# Patient Record
Sex: Female | Born: 1935 | Race: White | Hispanic: No | Smoking: Never smoker
Health system: Southern US, Community
[De-identification: ages and names within clinical notes are randomized; demographics above are authoritative.]

## PROBLEM LIST (undated history)

## (undated) DIAGNOSIS — R296 Repeated falls: Secondary | ICD-10-CM

## (undated) DIAGNOSIS — IMO0002 Reserved for concepts with insufficient information to code with codable children: Secondary | ICD-10-CM

## (undated) DIAGNOSIS — G309 Alzheimer's disease, unspecified: Secondary | ICD-10-CM

## (undated) DIAGNOSIS — F0280 Dementia in other diseases classified elsewhere without behavioral disturbance: Secondary | ICD-10-CM

## (undated) HISTORY — PX: CATARACT EXTRACTION, BILATERAL: SHX1313

## (undated) HISTORY — PX: WRIST SURGERY: SHX841

---

## 1998-07-12 ENCOUNTER — Emergency Department (HOSPITAL_COMMUNITY): Admission: EM | Admit: 1998-07-12 | Discharge: 1998-07-13 | Payer: Self-pay | Admitting: Emergency Medicine

## 1998-07-13 ENCOUNTER — Encounter: Payer: Self-pay | Admitting: Emergency Medicine

## 1998-08-18 ENCOUNTER — Ambulatory Visit (HOSPITAL_COMMUNITY): Admission: RE | Admit: 1998-08-18 | Discharge: 1998-08-18 | Payer: Self-pay

## 2001-11-14 ENCOUNTER — Encounter: Admission: RE | Admit: 2001-11-14 | Discharge: 2001-11-14 | Payer: Self-pay

## 2002-11-19 ENCOUNTER — Encounter: Admission: RE | Admit: 2002-11-19 | Discharge: 2002-11-19 | Payer: Self-pay

## 2003-11-21 ENCOUNTER — Encounter: Admission: RE | Admit: 2003-11-21 | Discharge: 2003-11-21 | Payer: Self-pay

## 2005-01-20 ENCOUNTER — Encounter: Admission: RE | Admit: 2005-01-20 | Discharge: 2005-01-20 | Payer: Self-pay

## 2006-01-31 ENCOUNTER — Encounter: Admission: RE | Admit: 2006-01-31 | Discharge: 2006-01-31 | Payer: Self-pay | Admitting: Family Medicine

## 2007-02-22 ENCOUNTER — Encounter: Admission: RE | Admit: 2007-02-22 | Discharge: 2007-02-22 | Payer: Self-pay | Admitting: Family Medicine

## 2007-03-15 ENCOUNTER — Encounter: Admission: RE | Admit: 2007-03-15 | Discharge: 2007-03-15 | Payer: Self-pay | Admitting: Family Medicine

## 2008-04-08 ENCOUNTER — Encounter: Admission: RE | Admit: 2008-04-08 | Discharge: 2008-04-08 | Payer: Self-pay | Admitting: Family Medicine

## 2008-09-04 ENCOUNTER — Ambulatory Visit: Payer: Self-pay | Admitting: Ophthalmology

## 2008-09-17 ENCOUNTER — Ambulatory Visit: Payer: Self-pay | Admitting: Ophthalmology

## 2009-04-09 ENCOUNTER — Encounter: Admission: RE | Admit: 2009-04-09 | Discharge: 2009-04-09 | Payer: Self-pay | Admitting: Family Medicine

## 2010-03-17 ENCOUNTER — Encounter: Admission: RE | Admit: 2010-03-17 | Discharge: 2010-03-17 | Payer: Self-pay | Admitting: Neurology

## 2010-10-11 ENCOUNTER — Emergency Department (HOSPITAL_COMMUNITY): Payer: Medicare Other

## 2010-10-11 ENCOUNTER — Emergency Department (HOSPITAL_COMMUNITY)
Admission: EM | Admit: 2010-10-11 | Discharge: 2010-10-11 | Disposition: A | Discharge status: Home / Self Care | Payer: Medicare Other | Attending: Emergency Medicine | Admitting: Emergency Medicine

## 2010-10-11 DIAGNOSIS — F0280 Dementia in other diseases classified elsewhere without behavioral disturbance: Secondary | ICD-10-CM | POA: Insufficient documentation

## 2010-10-11 DIAGNOSIS — M899 Disorder of bone, unspecified: Secondary | ICD-10-CM | POA: Insufficient documentation

## 2010-10-11 DIAGNOSIS — W1809XA Striking against other object with subsequent fall, initial encounter: Secondary | ICD-10-CM | POA: Insufficient documentation

## 2010-10-11 DIAGNOSIS — G309 Alzheimer's disease, unspecified: Secondary | ICD-10-CM | POA: Insufficient documentation

## 2010-10-11 DIAGNOSIS — S52599A Other fractures of lower end of unspecified radius, initial encounter for closed fracture: Secondary | ICD-10-CM | POA: Insufficient documentation

## 2010-10-11 DIAGNOSIS — M949 Disorder of cartilage, unspecified: Secondary | ICD-10-CM | POA: Insufficient documentation

## 2010-11-22 ENCOUNTER — Emergency Department (HOSPITAL_COMMUNITY)
Admission: EM | Admit: 2010-11-22 | Discharge: 2010-11-22 | Disposition: A | Discharge status: Home / Self Care | Payer: Medicare Other | Attending: Emergency Medicine | Admitting: Emergency Medicine

## 2010-11-22 ENCOUNTER — Emergency Department (HOSPITAL_COMMUNITY): Payer: Medicare Other

## 2010-11-22 DIAGNOSIS — R079 Chest pain, unspecified: Secondary | ICD-10-CM | POA: Insufficient documentation

## 2010-11-22 DIAGNOSIS — J90 Pleural effusion, not elsewhere classified: Secondary | ICD-10-CM | POA: Insufficient documentation

## 2010-11-22 DIAGNOSIS — M19049 Primary osteoarthritis, unspecified hand: Secondary | ICD-10-CM | POA: Insufficient documentation

## 2010-11-22 DIAGNOSIS — IMO0002 Reserved for concepts with insufficient information to code with codable children: Secondary | ICD-10-CM | POA: Insufficient documentation

## 2010-11-22 DIAGNOSIS — W010XXA Fall on same level from slipping, tripping and stumbling without subsequent striking against object, initial encounter: Secondary | ICD-10-CM | POA: Insufficient documentation

## 2010-11-22 DIAGNOSIS — F0280 Dementia in other diseases classified elsewhere without behavioral disturbance: Secondary | ICD-10-CM | POA: Insufficient documentation

## 2010-11-22 DIAGNOSIS — G309 Alzheimer's disease, unspecified: Secondary | ICD-10-CM | POA: Insufficient documentation

## 2010-11-22 DIAGNOSIS — S42009A Fracture of unspecified part of unspecified clavicle, initial encounter for closed fracture: Secondary | ICD-10-CM | POA: Insufficient documentation

## 2011-01-21 ENCOUNTER — Other Ambulatory Visit (HOSPITAL_COMMUNITY): Payer: Self-pay | Admitting: Family Medicine

## 2011-01-21 DIAGNOSIS — R011 Cardiac murmur, unspecified: Secondary | ICD-10-CM

## 2011-01-25 ENCOUNTER — Ambulatory Visit (HOSPITAL_COMMUNITY): Payer: Medicare Other | Attending: Cardiology | Admitting: Radiology

## 2011-01-25 DIAGNOSIS — F0390 Unspecified dementia without behavioral disturbance: Secondary | ICD-10-CM | POA: Insufficient documentation

## 2011-01-25 DIAGNOSIS — R011 Cardiac murmur, unspecified: Secondary | ICD-10-CM | POA: Insufficient documentation

## 2011-01-25 DIAGNOSIS — I079 Rheumatic tricuspid valve disease, unspecified: Secondary | ICD-10-CM | POA: Insufficient documentation

## 2011-01-25 DIAGNOSIS — I379 Nonrheumatic pulmonary valve disorder, unspecified: Secondary | ICD-10-CM | POA: Insufficient documentation

## 2011-01-25 DIAGNOSIS — I059 Rheumatic mitral valve disease, unspecified: Secondary | ICD-10-CM | POA: Insufficient documentation

## 2011-01-25 DIAGNOSIS — I319 Disease of pericardium, unspecified: Secondary | ICD-10-CM | POA: Insufficient documentation

## 2011-01-26 ENCOUNTER — Encounter (HOSPITAL_COMMUNITY): Payer: Self-pay | Admitting: Family Medicine

## 2011-07-19 ENCOUNTER — Other Ambulatory Visit: Payer: Self-pay | Admitting: Family Medicine

## 2011-08-04 ENCOUNTER — Other Ambulatory Visit: Payer: Medicare Other

## 2011-09-08 ENCOUNTER — Ambulatory Visit
Admission: RE | Admit: 2011-09-08 | Discharge: 2011-09-08 | Disposition: A | Discharge status: Home / Self Care | Payer: Medicare Other | Source: Ambulatory Visit | Attending: Family Medicine | Admitting: Family Medicine

## 2013-02-02 ENCOUNTER — Encounter (HOSPITAL_COMMUNITY): Payer: Self-pay

## 2013-02-02 ENCOUNTER — Inpatient Hospital Stay (HOSPITAL_COMMUNITY): Payer: Medicare Other

## 2013-02-02 ENCOUNTER — Emergency Department (HOSPITAL_COMMUNITY): Payer: Medicare Other

## 2013-02-02 ENCOUNTER — Inpatient Hospital Stay (HOSPITAL_COMMUNITY)
Admission: EM | Admit: 2013-02-02 | Discharge: 2013-02-05 | DRG: 562 | Disposition: A | Discharge status: Skilled Nursing Facility | Payer: Medicare Other | Attending: Orthopedic Surgery | Admitting: Orthopedic Surgery

## 2013-02-02 DIAGNOSIS — J189 Pneumonia, unspecified organism: Secondary | ICD-10-CM | POA: Diagnosis present

## 2013-02-02 DIAGNOSIS — R296 Repeated falls: Secondary | ICD-10-CM

## 2013-02-02 DIAGNOSIS — S52599A Other fractures of lower end of unspecified radius, initial encounter for closed fracture: Secondary | ICD-10-CM

## 2013-02-02 DIAGNOSIS — Z66 Do not resuscitate: Secondary | ICD-10-CM | POA: Diagnosis present

## 2013-02-02 DIAGNOSIS — Z880 Allergy status to penicillin: Secondary | ICD-10-CM

## 2013-02-02 DIAGNOSIS — Z79899 Other long term (current) drug therapy: Secondary | ICD-10-CM

## 2013-02-02 DIAGNOSIS — F0281 Dementia in other diseases classified elsewhere with behavioral disturbance: Secondary | ICD-10-CM | POA: Diagnosis present

## 2013-02-02 DIAGNOSIS — S62109A Fracture of unspecified carpal bone, unspecified wrist, initial encounter for closed fracture: Secondary | ICD-10-CM | POA: Diagnosis present

## 2013-02-02 DIAGNOSIS — G309 Alzheimer's disease, unspecified: Secondary | ICD-10-CM | POA: Diagnosis present

## 2013-02-02 DIAGNOSIS — S52202A Unspecified fracture of shaft of left ulna, initial encounter for closed fracture: Secondary | ICD-10-CM

## 2013-02-02 DIAGNOSIS — S42209A Unspecified fracture of upper end of unspecified humerus, initial encounter for closed fracture: Secondary | ICD-10-CM | POA: Diagnosis present

## 2013-02-02 DIAGNOSIS — S52609A Unspecified fracture of lower end of unspecified ulna, initial encounter for closed fracture: Principal | ICD-10-CM | POA: Diagnosis present

## 2013-02-02 DIAGNOSIS — S42309A Unspecified fracture of shaft of humerus, unspecified arm, initial encounter for closed fracture: Secondary | ICD-10-CM

## 2013-02-02 DIAGNOSIS — F0280 Dementia in other diseases classified elsewhere without behavioral disturbance: Secondary | ICD-10-CM | POA: Diagnosis present

## 2013-02-02 DIAGNOSIS — S52509A Unspecified fracture of the lower end of unspecified radius, initial encounter for closed fracture: Principal | ICD-10-CM | POA: Diagnosis present

## 2013-02-02 DIAGNOSIS — Z9181 History of falling: Secondary | ICD-10-CM

## 2013-02-02 DIAGNOSIS — S5292XA Unspecified fracture of left forearm, initial encounter for closed fracture: Secondary | ICD-10-CM

## 2013-02-02 DIAGNOSIS — Y92009 Unspecified place in unspecified non-institutional (private) residence as the place of occurrence of the external cause: Secondary | ICD-10-CM

## 2013-02-02 DIAGNOSIS — S52502A Unspecified fracture of the lower end of left radius, initial encounter for closed fracture: Secondary | ICD-10-CM

## 2013-02-02 DIAGNOSIS — R011 Cardiac murmur, unspecified: Secondary | ICD-10-CM

## 2013-02-02 DIAGNOSIS — W010XXA Fall on same level from slipping, tripping and stumbling without subsequent striking against object, initial encounter: Secondary | ICD-10-CM | POA: Diagnosis present

## 2013-02-02 DIAGNOSIS — D696 Thrombocytopenia, unspecified: Secondary | ICD-10-CM

## 2013-02-02 DIAGNOSIS — F02818 Dementia in other diseases classified elsewhere, unspecified severity, with other behavioral disturbance: Secondary | ICD-10-CM | POA: Diagnosis present

## 2013-02-02 HISTORY — DX: Repeated falls: R29.6

## 2013-02-02 HISTORY — DX: Alzheimer's disease, unspecified: G30.9

## 2013-02-02 HISTORY — DX: Dementia in other diseases classified elsewhere, unspecified severity, without behavioral disturbance, psychotic disturbance, mood disturbance, and anxiety: F02.80

## 2013-02-02 HISTORY — DX: Reserved for concepts with insufficient information to code with codable children: IMO0002

## 2013-02-02 LAB — CBC
HCT: 37 % (ref 36.0–46.0)
Hemoglobin: 12.1 g/dL (ref 12.0–15.0)
MCH: 29.2 pg (ref 26.0–34.0)
MCHC: 32.7 g/dL (ref 30.0–36.0)
MCV: 89.4 fL (ref 78.0–100.0)
RBC: 4.14 MIL/uL (ref 3.87–5.11)

## 2013-02-02 LAB — BASIC METABOLIC PANEL
BUN: 18 mg/dL (ref 6–23)
CO2: 25 mEq/L (ref 19–32)
GFR calc non Af Amer: 72 mL/min — ABNORMAL LOW (ref >90)
Glucose, Bld: 127 mg/dL — ABNORMAL HIGH (ref 70–99)
Potassium: 3.9 mEq/L (ref 3.5–5.1)
Sodium: 138 mEq/L (ref 135–145)

## 2013-02-02 MED ORDER — TUBERCULIN PPD 5 UNIT/0.1ML ID SOLN
5.0000 [IU] | Freq: Once | INTRADERMAL | Status: AC
  Administered 2013-02-03: 5 [IU] via INTRADERMAL
  Filled 2013-02-02: qty 0.1

## 2013-02-02 MED ORDER — SODIUM CHLORIDE 0.9 % IV SOLN: INTRAVENOUS | Status: DC

## 2013-02-02 MED ORDER — RISPERIDONE 0.5 MG PO TABS
0.5000 mg | ORAL_TABLET | Freq: Every day | ORAL | Status: DC
  Filled 2013-02-02 (×5): qty 1

## 2013-02-02 MED ORDER — ACETAMINOPHEN 325 MG PO TABS
650.0000 mg | ORAL_TABLET | Freq: Four times a day (QID) | ORAL | Status: DC | PRN
  Administered 2013-02-03 – 2013-02-05 (×4): 650 mg via ORAL
  Filled 2013-02-02 (×5): qty 2

## 2013-02-02 MED ORDER — ONDANSETRON HCL 4 MG/2ML IJ SOLN: 4.0000 mg | Freq: Four times a day (QID) | INTRAMUSCULAR | Status: DC | PRN

## 2013-02-02 MED ORDER — MEMANTINE HCL 10 MG PO TABS
10.0000 mg | ORAL_TABLET | Freq: Two times a day (BID) | ORAL | Status: DC
  Administered 2013-02-03: 10 mg via ORAL
  Filled 2013-02-02 (×3): qty 1

## 2013-02-02 MED ORDER — ONDANSETRON HCL 4 MG/2ML IJ SOLN
4.0000 mg | Freq: Once | INTRAMUSCULAR | Status: AC
  Administered 2013-02-02: 4 mg via INTRAVENOUS
  Filled 2013-02-02: qty 2

## 2013-02-02 MED ORDER — SODIUM CHLORIDE 0.45 % IV SOLN: INTRAVENOUS | Status: DC

## 2013-02-02 MED ORDER — DONEPEZIL HCL 10 MG PO TABS
10.0000 mg | ORAL_TABLET | Freq: Every day | ORAL | Status: DC
  Filled 2013-02-02 (×4): qty 1

## 2013-02-02 MED ORDER — MORPHINE SULFATE 4 MG/ML IJ SOLN
4.0000 mg | Freq: Once | INTRAMUSCULAR | Status: AC
  Administered 2013-02-02: 4 mg via INTRAVENOUS
  Filled 2013-02-02: qty 1

## 2013-02-02 MED ORDER — CITALOPRAM HYDROBROMIDE 10 MG PO TABS
10.0000 mg | ORAL_TABLET | Freq: Every day | ORAL | Status: DC
  Administered 2013-02-02 – 2013-02-05 (×4): 10 mg via ORAL
  Filled 2013-02-02 (×4): qty 1

## 2013-02-02 MED ORDER — MORPHINE SULFATE 2 MG/ML IJ SOLN
2.0000 mg | INTRAMUSCULAR | Status: DC | PRN
  Filled 2013-02-02: qty 1

## 2013-02-02 MED ORDER — HALOPERIDOL LACTATE 5 MG/ML IJ SOLN
0.5000 mg | Freq: Four times a day (QID) | INTRAMUSCULAR | Status: DC | PRN
  Administered 2013-02-03: 0.5 mg via INTRAVENOUS
  Filled 2013-02-02: qty 1

## 2013-02-02 NOTE — ED Notes (Signed)
Pt fell this am and injured her left wrist and forearm, obvious deformity and bruising

## 2013-02-02 NOTE — ED Provider Notes (Addendum)
CSN: [id number]     Arrival date & time 02/02/13  0621 History     First MD Initiated Contact with Patient 02/02/13 0636     Chief Complaint  Patient presents with  . Wrist Injury   (Consider location/radiation/quality/duration/timing/severity/associated sxs/prior Treatment) Patient is a 79 y.o. female presenting with wrist injury. The history is provided by the patient and a relative.  Wrist Injury Associated symptoms: no back pain, no fever and no neck pain   s/p fall at home this morning.  At baseline pt w very limited, shaky mobility, has 24/7 caregiver, this am got up on own, fell onto left wrist. C/o left wrist pain and deformity, constant, dull, moderate pain, worse w palpation or movement. No numbness. Skin intact. Denies loc or faintness. No neck or back pain. No headache.     Past Medical History  Diagnosis Date  . Alzheimer's dementia    History reviewed. No pertinent past surgical history. History reviewed. No pertinent family history. History  Substance Use Topics  . Smoking status: Not on file  . Smokeless tobacco: Not on file  . Alcohol Use: No   OB History   Grav Para Term Preterm Abortions TAB SAB Ect Mult Living                 Review of Systems  Constitutional: Negative for fever and chills.  HENT: Negative for neck pain.   Eyes: Negative for redness.  Respiratory: Negative for shortness of breath.   Cardiovascular: Negative for chest pain.  Gastrointestinal: Negative for abdominal pain.  Genitourinary: Negative for flank pain.  Musculoskeletal: Negative for back pain.  Skin: Negative for rash.  Neurological: Negative for headaches.  Hematological: Does not bruise/bleed easily.  Psychiatric/Behavioral: Negative for confusion.    Allergies  Review of patient's allergies indicates no known allergies.  Home Medications   Current Outpatient Rx  Name  Route  Sig  Dispense  Refill  . citalopram (CELEXA) 20 MG tablet   Oral   Take 20 mg by mouth  daily.         Marland Kitchen donepezil (ARICEPT) 10 MG tablet   Oral   Take 10 mg by mouth at bedtime as needed.         . memantine (NAMENDA) 10 MG tablet   Oral   Take 10 mg by mouth 2 (two) times daily.          BP 153/62  Pulse 62  Temp(Src) 97.6 F (36.4 C) (Oral)  Resp 16  SpO2 96% Physical Exam  Nursing note and vitals reviewed. Constitutional: She is oriented to person, place, and time. She appears well-developed and well-nourished. No distress.  HENT:  Head: Atraumatic.  Eyes: Conjunctivae are normal. Pupils are equal, round, and reactive to light. No scleral icterus.  Neck: Neck supple. No tracheal deviation present.  Cardiovascular: Normal rate.   Pulmonary/Chest: Effort normal. No respiratory distress.  Abdominal: Normal appearance. She exhibits no distension.  Musculoskeletal:  Deformity, sts, tenderness left wrist, skin intact. Radial pulse 2+.   Neurological: She is alert and oriented to person, place, and time.  Able to move digits of left hand, no numbness.  R/n/u fxn intact.   Skin: Skin is warm and dry. No rash noted.  Psychiatric: She has a normal mood and affect.    ED Course   Procedures (including critical care time) Dg Forearm Left  02/02/2013   *RADIOLOGY REPORT*  Clinical Data: Fall with wrist pain  LEFT FOREARM - 2  VIEW  Comparison: None  Findings: Comminuted fracture distal radius extending into the wrist joint.  Fracture of the styloid does not show significant displacement.  Proximal radius and ulna are intact.  IMPRESSION: Comminuted intra-articular fracture distal radius with dorsal displacement.  Minimally-displaced fracture distal ulna.   Original Report Authenticated By: Janeece Riggers, M.D.   Dg Wrist Complete Left  02/02/2013   *RADIOLOGY REPORT*  Clinical Data: Fall  LEFT WRIST - COMPLETE 3+ VIEW  Comparison: none  Findings: Comminuted fracture distal radius extending into the wrist joint.  There is dorsal displacement and angulation. Fracture of  distal ulna with mild dorsal displacement.  IMPRESSION: Comminuted intra-articular fracture distal radius with dorsal displacement.  There is also a fracture of the distal ulna with dorsal displacement.   Original Report Authenticated By: Janeece Riggers, M.D.     MDM  Iv ns. Morphine. Ice, splint.   Called ortho hand.  Recheck pain improved.  Sugar tong splint to left wrist.   Ice/elevate.  Discussed w dr Amanda Pea incl fam request for surgical repair earlier than later - he/his PA will see in ed, and plan to admit for repair.    Date: 02/02/2013  Rate: 78  Rhythm: normal sinus rhythm  QRS Axis: left  Intervals: normal  ST/T Wave abnormalities: nonspecific T wave changes  Conduction Disutrbances:none  Narrative Interpretation:   Old EKG Reviewed: none available    Suzi Roots, MD 02/02/13 0825  Suzi Roots, MD 02/02/13 1343

## 2013-02-02 NOTE — H&P (Signed)
Peggy Pratt is an 79 y.o. female.   Chief Complaint: Wrist fracture per emergency room staff HPI: The patient and 79 year old female who presented to the emergency room earlier this morning after a fall she sustained at home. She has a history of progressive dementia and does have full-time care at home as she is unable to perform activities of daily living. Earlier this morning she tripped and fell noting pain and difficulties with the left upper extremity. She was brought to the emergency room for initial evaluation. She was found to have a highly comminuted displaced distal radius fracture about the left upper extremity. Contact in regards to the upper extremity recommended preoperative chest x-ray EKG and typical labs on an evaluation the patient has been transferred to the for is no longer in the emergency room setting. Her daughter is an Human resources officer here at [employer]. Patient is a pleasant upon an initial evaluation, she is demented and does not know person place or time. She is not currently having complaints to the left upper evaluation showed that she has a highly comminuted distal radius fracture in addition in reviewing her chest radiograph is noted to have a proximal humerus fracture about the left upper extremity preoperative chest x-ray didn't show findings consistent with possible bibasilar patient to per history and given her significant dementia. However, her daughter-in-law, who is here states that she has had no medical issues other than the dementia and has had no complaints in terms of, cough or an acute cough, she denies any length of chest pain or shortness of breath states she has noted that the patient has had progressive worsening of her dementia over the past 18 months.  Past Medical History  Diagnosis Date  . Alzheimer's dementia     Past Surgical History  Procedure Laterality Date  . No past surgeries      History reviewed. No pertinent family history. Social  History:  reports that she has never smoked. She has never used smokeless tobacco. She reports that she does not drink alcohol or use illicit drugs.  Allergies: No Known Allergies  Medications Prior to Admission  Medication Sig Dispense Refill  . citalopram (CELEXA) 20 MG tablet Take 10 mg by mouth daily.       Marland Kitchen donepezil (ARICEPT) 10 MG tablet Take 10 mg by mouth at bedtime as needed.      . memantine (NAMENDA) 10 MG tablet Take 10 mg by mouth 2 (two) times daily.        Results for orders placed during the hospital encounter of 02/02/13 (from the past 48 hour(s))  BASIC METABOLIC PANEL     Status: Abnormal   Collection Time    02/02/13  9:38 AM      Result Value Range   Sodium 138  135 - 145 mEq/L   Potassium 3.9  3.5 - 5.1 mEq/L   Chloride 103  96 - 112 mEq/L   CO2 25  19 - 32 mEq/L   Glucose, Bld 127 (*) 70 - 99 mg/dL   BUN 18  6 - 23 mg/dL   Creatinine, Ser 1.61  0.50 - 1.10 mg/dL   Calcium 8.7  8.4 - 09.6 mg/dL   GFR calc non Af Amer 72 (*) >90 mL/min   GFR calc Af Amer 84 (*) >90 mL/min   Comment:            The eGFR has been calculated     using the CKD EPI equation.  This calculation has not been     validated in all clinical     situations.     eGFR's persistently     <90 mL/min signify     possible Chronic Kidney Disease.  CBC     Status: Abnormal   Collection Time    02/02/13  9:38 AM      Result Value Range   WBC 10.1  4.0 - 10.5 K/uL   RBC 4.14  3.87 - 5.11 MIL/uL   Hemoglobin 12.1  12.0 - 15.0 g/dL   HCT 95.6  21.3 - 08.6 %   MCV 89.4  78.0 - 100.0 fL   MCH 29.2  26.0 - 34.0 pg   MCHC 32.7  30.0 - 36.0 g/dL   RDW 57.8  46.9 - 62.9 %   Platelets 146 (*) 150 - 400 K/uL  PROTIME-INR     Status: None   Collection Time    02/02/13  2:42 PM      Result Value Range   Prothrombin Time 14.2  11.6 - 15.2 seconds   INR 1.12  0.00 - 1.49   Dg Chest 1 View  02/02/2013   *RADIOLOGY REPORT*  Clinical Data: Wrist injury.  CHEST - 1 VIEW  Comparison:  11/22/2010  Findings: Lungs are hypoinflated with mild left base opacification suggesting a small amount of left pleural fluid with associated atelectasis, although cannot exclude infection.  Mild right base opacification which may be due to atelectasis versus infection. Borderline cardiomegaly.  Mediastinum otherwise unremarkable. Diffuse decreased bone density.  There is a displaced left humeral neck fracture which may be acute.  IMPRESSION: Hypoinflation with bibasilar opacification left worse than right suggesting atelectasis versus infection.  Possible small amount of left pleural fluid.  Displaced left humeral neck fracture which may be acute. Recommend clinical correlation.   Original Report Authenticated By: Elberta Fortis, M.D.   Dg Forearm Left  02/02/2013   *RADIOLOGY REPORT*  Clinical Data: Fall with wrist pain  LEFT FOREARM - 2 VIEW  Comparison: None  Findings: Comminuted fracture distal radius extending into the wrist joint.  Fracture of the styloid does not show significant displacement.  Proximal radius and ulna are intact.  IMPRESSION: Comminuted intra-articular fracture distal radius with dorsal displacement.  Minimally-displaced fracture distal ulna.   Original Report Authenticated By: Janeece Riggers, M.D.   Dg Wrist Complete Left  02/02/2013   *RADIOLOGY REPORT*  Clinical Data: Fall  LEFT WRIST - COMPLETE 3+ VIEW  Comparison: none  Findings: Comminuted fracture distal radius extending into the wrist joint.  There is dorsal displacement and angulation. Fracture of distal ulna with mild dorsal displacement.  IMPRESSION: Comminuted intra-articular fracture distal radius with dorsal displacement.  There is also a fracture of the distal ulna with dorsal displacement.   Original Report Authenticated By: Janeece Riggers, M.D.    ROS Of systems is difficult to obtain given the patient's significant dementia  Blood pressure 135/71, pulse 71, temperature 98.6 F (37 C), temperature source Oral, resp.  rate 12, SpO2 98.00%. Physical Exam Physical examination she is pleasant where she is oriented to person place or time he did not appear to be in a high degree of discomfort currently does not know the wrist or humerus is fractured Head is atraumatic normocephalic Chest: Expansions are equal and nonlabored however chest sounds are somewhat diminished diffusely as she breaths quite shallow Heart: Noted murmur S1-S2 Abdomen: Bowel sign hypoactive Left upper extremity shows that she has ecchymosis about the proximal  shoulder region in addition she is pleased been placed into a sugar tong splint in the emergency room setting, digital range of motion intact sensation is intact Right upper extremity shows that she has full range of motion is  Pelvis is nontender extremity examination shows she is nontender with hip flexion extension internal and external rotation she is able to straight leg raise knees are nontender she is nontender about calves ankles   Assessment/Plan 79 year old female with progressive dementia and a noted a highly comminuted distal radius fracture intra-articular in nature with displacement present nondominant hand Left proximal humerus fracture Cardiac Murmur question new onset Rule out Pneumonia/pulmomary infection The issues at hand with the patient's progressive dementia her need for full-time assistance in creation of a new cardiac murmur we have discussed with her daughter-in-law attempts at closed reduction and casting conservative course of care loss of treat the proximal humerus fracture and conservative course. Should she have worsening up and progressive angle at angulation up one may have to fix the upper extremity operatively however it is hoped that after the reduction this could be avoided. Her daughter in law is in agreement. After obtaining verbal consent we performed closed reduction measures to the left distal radius fracture, postreduction film showed improved  alignment. She is placed into a well molded sugar tong cast with elevation and sling immobilizer given the proximal humerus fracture as well. We have contacted hospitalist for therapy evaluation and management of the noted medical findings above and for consultation throughout her stay for any medical issues that may arise. We'll obtain a case care consult for discharge planning, needed to minimize any narcotic use while she is in  patient she is very sensitive to this. All questions were encouraged and answered and discussed with her family at length.  Jahrell Hamor L 02/02/2013, 5:54 PM

## 2013-02-02 NOTE — Progress Notes (Signed)
Proxy forms given to family for MyChart Activation

## 2013-02-02 NOTE — ED Provider Notes (Signed)
MSE was initiated and I personally evaluated the patient and placed orders (if any) at  6:38 AM on February 02, 2013.  The patient appears stable so that the remainder of the MSE may be completed by another provider.  Fall with left wrist deformity. Normal left radial pulse. No head injury, no neck pain. Xrays, pain meds, zofran for nausea  Lyanne Co, MD 02/02/13 [phone]

## 2013-02-02 NOTE — Progress Notes (Signed)
Orthopedic Tech Progress Note Patient Details:  Peggy Pratt July 11, 1920 [id number] Assisted Dr. Amanda Pea with placement of long arm cast onto pt.'s LUE.  Motion, sensation, capillary refill intact before and after casting.  Capillary refill less than 2 seconds.  Placed sling with shoulder immobilizer on pt.'s LUE after casting. Casting Type of Cast: Long arm cast Cast Location: LUE Cast Material: Fiberglass Cast Intervention: Application     Lesle Chris 02/02/2013, 6:36 PM

## 2013-02-02 NOTE — Consult Note (Addendum)
[facility] Medical Consultation  Peggy Pratt JXB:[id number] DOB: January 04, 1921 DOA: 02/02/2013 PCP: Peggy Ravel, MD   Requesting physician: Peggy Pratt Date of consultation: 02/02/2013 Reason for consultation: management of chronic medical conditions  Impression/Recommendations Principal Problem:   Recurrent falls Active Problems:   Alzheimer's dementia   Heart murmur   Humerus fracture, left   Left ulnar fracture   Left radial fracture   Principal Problem:   Recurrent falls, likely due to progressive dementia -  PT/OT evaluations -  Falls precautions  Active Problems:   Alzheimer's dementia with history of sundowning -  B12, TSH, RPR - Continue home medications -  Add risperidone QHS and haldol prn -  Frequent reorientation -  Minimize sedating medications   Heart murmur, currently asymptomatic -  Outpatient follow up  Infiltrate vs. Atelectasis on CXR.  Afebrile without leukocytosis and asymptomatic.  Likely atelectasis, however, cannot exclude possibility of silent aspiration given progressive dementia.  Observe clinically and no antibiotics at this time. -  Swallow evaluation -  OOB and frequent ambulation -  IS     Humerus fracture, left   Left ulnar fracture   Left radial fracture -  Management per orthopedics -  Vitamin D level  Thrombocytopenia, mild and likely due to fractures -  Repeat in AM  I will followup again tomorrow. Please contact me if I can be of assistance in the meanwhile. Thank you for this consultation.  Chief Complaint: fall with left radial, ulnar, and humeral fractures  HPI:  The patient is an 79 yo F with history of Alzheimer's dementia and recurrent falls who presents with mechanical fall resulting in left radial, ulnar, and humeral fractures which have been splinted by Orthopedics.  The patient has dementia and contributes little to the history.  Per daughter-in-law, Peggy Pratt has had dementia for several years and has  been ambulatory, living in her home with 24 hour caregivers.  Over the last several months, her confusion has gotten worse and she has become incontinent, become completely dependent for ADLs except she is still able to feed herself albeit slowly.  She has not had any problems with recurrent infectious such as pneumonia or urinary tract infections and has otherwise been healthy.  Her family is considering transitioning to SNF at this time because with her arm splinted, she will require additional care that the home caregiver may not be able to provide.    Review of Systems:   Limited by dementia and family member does not live with patient.  Daughter in law does not think she has had any recent cough or fever because the caregiver would have called to notify her or her husband.    Past Medical History  Diagnosis Date  . Alzheimer's dementia   . Recurrent falls   . Compression fracture    Past Surgical History  Procedure Laterality Date  . Wrist surgery Right   . Cataract extraction, bilateral     Social History:  reports that she has never smoked. She has never used smokeless tobacco. She reports that she does not drink alcohol or use illicit drugs.  Allergies  Allergen Reactions  . Penicillins     Unknown reaction   Family History  Problem Relation Age of Onset  . Alzheimer's disease Neg Hx   . Heart disease Neg Hx     Prior to Admission medications   Medication Sig Start Date End Date Taking? Authorizing Provider  citalopram (CELEXA) 20 MG tablet Take 10 mg  by mouth daily.    Yes Historical Provider, MD  donepezil (ARICEPT) 10 MG tablet Take 10 mg by mouth at bedtime as needed.   Yes Historical Provider, MD  memantine (NAMENDA) 10 MG tablet Take 10 mg by mouth 2 (two) times daily.   Yes Historical Provider, MD   Physical Exam: Blood pressure 135/71, pulse 71, temperature 98.6 F (37 C), temperature source Oral, resp. rate 12, SpO2 98.00%. Filed Vitals:   02/02/13 1200  02/02/13 1230 02/02/13 1300 02/02/13 1400  BP: 135/75 126/73 125/68 135/71  Pulse: 66 67 64 71  Temp:    98.6 F (37 C)  TempSrc:    Oral  Resp:    12  SpO2: 98% 98% 98% 98%     General:  Thin CF, NAD, pleasant and cooperative.    Eyes:  PERRL, anicteric, non-injected.  ENT:  Nares clear.  OP clear, non-erythematous without plaques or exudates.  MMM.  Neck:  Supple without TM or JVD.    Lymph:  No cervical, supraclavicular, or submandibular LAD.  Cardiovascular:  RRR, normal S1, S2, 2/6 holosystolic murmur at the LSB.  2+ pulses, warm extremities  Respiratory:  Diminished with faint rales that clear somewhat at the bilateral posterior axilla.  No rhonchi or wheeze.   Abdomen:  NABS.  Soft, ND/NT.    Skin:  No rashes or focal lesions.  Musculoskeletal:  Normal bulk and tone.  No LE edema.  Left arm splinted in sling, fingers warm and well perfused, < 2sec CR  Psychiatric:  A & O to person, place, and day of week, but not situation.  Appropriate affect.  Neurologic:  CN 3-12 intact.  5/5 strength.  Sensation intact.  Exam limited on LUE by splint.  Gait deferred.    Labs on Admission:  Basic Metabolic Panel:  Recent Labs Lab 02/02/13 0938  NA 138  K 3.9  CL 103  CO2 25  GLUCOSE 127*  BUN 18  CREATININE 0.77  CALCIUM 8.7   Liver Function Tests: No results found for this basename: AST, ALT, ALKPHOS, BILITOT, PROT, ALBUMIN,  in the last 168 hours No results found for this basename: LIPASE, AMYLASE,  in the last 168 hours No results found for this basename: AMMONIA,  in the last 168 hours CBC:  Recent Labs Lab 02/02/13 0938  WBC 10.1  HGB 12.1  HCT 37.0  MCV 89.4  PLT 146*   Cardiac Enzymes: No results found for this basename: CKTOTAL, CKMB, CKMBINDEX, TROPONINI,  in the last 168 hours BNP: No components found with this basename: POCBNP,  CBG: No results found for this basename: GLUCAP,  in the last 168 hours  Radiological Exams on Admission: Dg  Chest 1 View  02/02/2013   *RADIOLOGY REPORT*  Clinical Data: Wrist injury.  CHEST - 1 VIEW  Comparison: 11/22/2010  Findings: Lungs are hypoinflated with mild left base opacification suggesting a small amount of left pleural fluid with associated atelectasis, although cannot exclude infection.  Mild right base opacification which may be due to atelectasis versus infection. Borderline cardiomegaly.  Mediastinum otherwise unremarkable. Diffuse decreased bone density.  There is a displaced left humeral neck fracture which may be acute.  IMPRESSION: Hypoinflation with bibasilar opacification left worse than right suggesting atelectasis versus infection.  Possible small amount of left pleural fluid.  Displaced left humeral neck fracture which may be acute. Recommend clinical correlation.   Original Report Authenticated By: Elberta Fortis, M.D.   Dg Forearm Left  02/02/2013   *  RADIOLOGY REPORT*  Clinical Data: Fall with wrist pain  LEFT FOREARM - 2 VIEW  Comparison: None  Findings: Comminuted fracture distal radius extending into the wrist joint.  Fracture of the styloid does not show significant displacement.  Proximal radius and ulna are intact.  IMPRESSION: Comminuted intra-articular fracture distal radius with dorsal displacement.  Minimally-displaced fracture distal ulna.   Original Report Authenticated By: Janeece Riggers, M.D.   Dg Wrist Complete Left  02/02/2013   *RADIOLOGY REPORT*  Clinical Data: Fall  LEFT WRIST - COMPLETE 3+ VIEW  Comparison: none  Findings: Comminuted fracture distal radius extending into the wrist joint.  There is dorsal displacement and angulation. Fracture of distal ulna with mild dorsal displacement.  IMPRESSION: Comminuted intra-articular fracture distal radius with dorsal displacement.  There is also a fracture of the distal ulna with dorsal displacement.   Original Report Authenticated By: Janeece Riggers, M.D.    EKG: Independently reviewed. NSR with borderline   Time spent: 75  min  Ryla Cauthon [facility] Pager [phone]  If 7PM-7AM, please contact night-coverage [email or web address] Password [facility] 02/02/2013, 6:34 PM

## 2013-02-03 LAB — TSH: TSH: 3.218 u[IU]/mL (ref 0.350–4.500)

## 2013-02-03 LAB — CBC
MCH: 28.9 pg (ref 26.0–34.0)
MCHC: 31.9 g/dL (ref 30.0–36.0)
Platelets: 158 10*3/uL (ref 150–400)

## 2013-02-03 LAB — VITAMIN B12: Vitamin B-12: 352 pg/mL (ref 211–911)

## 2013-02-03 MED ORDER — ONDANSETRON 8 MG PO TBDP
8.0000 mg | ORAL_TABLET | Freq: Three times a day (TID) | ORAL | Status: DC | PRN
  Filled 2013-02-03: qty 1

## 2013-02-03 MED ORDER — MEMANTINE HCL ER 28 MG PO CP24: 28.0000 mg | ORAL_CAPSULE | Freq: Every day | ORAL | Status: DC

## 2013-02-03 MED ORDER — HALOPERIDOL LACTATE 5 MG/ML IJ SOLN: 0.5000 mg | Freq: Four times a day (QID) | INTRAMUSCULAR | Status: DC | PRN

## 2013-02-03 MED ORDER — HALOPERIDOL 0.5 MG PO TABS
0.5000 mg | ORAL_TABLET | Freq: Three times a day (TID) | ORAL | Status: DC | PRN
  Filled 2013-02-03 (×2): qty 1

## 2013-02-03 MED ORDER — MORPHINE SULFATE 2 MG/ML IJ SOLN: 2.0000 mg | INTRAMUSCULAR | Status: DC | PRN

## 2013-02-03 NOTE — Evaluation (Signed)
Physical Therapy Evaluation Patient Details Name: Peggy Pratt MRN: [id number] DOB: 1920-08-23 Today's Date: 02/03/2013 Time:  -     PT Assessment / Plan / Recommendation History of Present Illness  The patient and 79 year old female who presented to the emergency room earlier this morning after a fall she sustained at home. She has a history of progressive dementia and does have full-time care at home as she is unable to perform activities of daily living. Earlier this morning she tripped and fell noting pain and difficulties with the left upper extremity. She was brought to the emergency room for initial evaluation. She was found to have a highly comminuted displaced distal radius fracture  and proximal humerus fx about the left upper extremity.  Clinical Impression  Will recommend STSNF vs HHPT with continued 24hr assist depending on how much current caregivers can assist.  Pt requires +2 for basic mobility at this point.    PT Assessment  Patient needs continued PT services    Follow Up Recommendations  SNF    Does the patient have the potential to tolerate intense rehabilitation      Barriers to Discharge        Equipment Recommendations       Recommendations for Other Services     Frequency Min 3X/week    Precautions / Restrictions Precautions Precautions: Fall Type of Shoulder Precautions: pt is in long arm cast at approximately 90 degrees elbow flexion--non-surgical management of LUE fxs Shoulder Interventions: Shoulder sling/immobilizer;At all times Restrictions Weight Bearing Restrictions: Yes LUE Weight Bearing: Non weight bearing   Pertinent Vitals/Pain Min c/o pain when adjusting LUE shoulder immobilizer to correct position     Mobility  Bed Mobility Bed Mobility: Sit to Supine;Scooting to HOB Sit to Supine: 1: +2 Total assist Sit to Supine: Patient Percentage: 30% Scooting to HOB: 1: +2 Total assist Scooting to [facility]: Patient Percentage: 0% Details for  Bed Mobility Assistance: +2 for LUE NWB,  to control descent of trunk, LES into bed  adn for safety Transfers Transfers: Sit to Stand;Stand to Sit;Stand Pivot Transfers Sit to Stand: 1: +2 Total assist;From chair/3-in-1 Sit to Stand: Patient Percentage: 30% Stand to Sit: 1: +2 Total assist Stand to Sit: Patient Percentage: 30% Stand Pivot Transfers: 1: +2 Total assist Stand Pivot Transfers: Patient Percentage: 30% Details for Transfer Assistance: +2 for wt shift and NWB LUE, safety and fall prevention; pt knees buckle slightly with initial stand attempt    Exercises     PT Diagnosis: Generalized weakness;Difficulty walking  PT Problem List: Decreased activity tolerance;Decreased strength;Decreased balance;Decreased mobility;Decreased knowledge of use of DME;Decreased safety awareness;Decreased knowledge of precautions;Decreased cognition PT Treatment Interventions: DME instruction;Gait training;Functional mobility training;Therapeutic activities;Therapeutic exercise;Balance training;Patient/family education     PT Goals(Current goals can be found in the care plan section) Acute Rehab PT Goals Patient Stated Goal: none/unable PT Goal Formulation: With patient Time For Goal Achievement: 02/17/13 Potential to Achieve Goals: Good  Visit Information  Last PT Received On: 02/03/13 Assistance Needed: +2 History of Present Illness: The patient and 79 year old female who presented to the emergency room earlier this morning after a fall she sustained at home. She has a history of progressive dementia and does have full-time care at home as she is unable to perform activities of daily living. Earlier this morning she tripped and fell noting pain and difficulties with the left upper extremity. She was brought to the emergency room for initial evaluation. She was found to have a highly comminuted  displaced distal radius fracture  and proximal humerus fx about the left upper extremity.       Prior  Functioning  Home Living Family/patient expects to be discharged to:: Skilled nursing facility Communication Communication: No difficulties    Cognition  Cognition Arousal/Alertness: Lethargic Overall Cognitive Status: History of cognitive impairments - at baseline Area of Impairment: Orientation;Following commands;Safety/judgement;Awareness Memory: Decreased recall of precautions;Decreased short-term memory Following Commands: Follows one step commands with increased time Safety/Judgement: Decreased awareness of safety;Decreased awareness of deficits    Extremity/Trunk Assessment Upper Extremity Assessment Upper Extremity Assessment: Defer to OT evaluation Lower Extremity Assessment Lower Extremity Assessment: Generalized weakness   Balance Static Sitting Balance Static Sitting - Balance Support: Right upper extremity supported;Feet supported Static Sitting - Level of Assistance: 4: Min assist Static Standing Balance Static Standing - Balance Support: During functional activity;Right upper extremity supported Static Standing - Level of Assistance: 1: +2 Total assist  End of Session PT - End of Session Equipment Utilized During Treatment: Gait belt Activity Tolerance: Patient limited by fatigue;Patient limited by lethargy Patient left: in bed;with call bell/phone within reach;with nursing/sitter in room Nurse Communication: Mobility status  GP     [facility] 02/03/2013, 3:24 PM

## 2013-02-03 NOTE — Progress Notes (Signed)
Subjective: Patient is awake and in a chair and having her blood drawn at the moment. Her arm is resting comfortably with the left upper extremity casted and in a sling. The patient had a night which was filled with some degree of agitation as the patient required Haldol administration.She denies neck back chest or abdominal pain. She denies lower extremity pain.   she is comfortable and cooperative at the moment.  She is tolerating her diet reasonably well  Objective: Vital signs in last 24 hours: Temp:  [97.9 F (36.6 C)-98.6 F (37 C)] 97.9 F (36.6 C) (08/02 0500) Pulse Rate:  [64-73] 67 (08/02 0500) Resp:  [12-18] 18 (08/02 0500) BP: (112-135)/(68-73) 112/73 mmHg (08/02 0500) SpO2:  [98 %] 98 % (08/01 2200)  Intake/Output from previous day: 08/01 0701 - 08/02 0700 In: -  Out: 200 [Urine:200] Intake/Output this shift: Total I/O In: 360 [P.O.:360] Out: -    Recent Labs  02/02/13 0938 02/03/13 1212  HGB 12.1 11.5*    Recent Labs  02/02/13 0938 02/03/13 1212  WBC 10.1 7.9  RBC 4.14 3.98  HCT 37.0 36.1  PLT 146* 158    Recent Labs  02/02/13 0938  NA 138  K 3.9  CL 103  CO2 25  BUN 18  CREATININE 0.77  GLUCOSE 127*  CALCIUM 8.7    Recent Labs  02/02/13 1442  INR 1.12    Physical exam:  She has no signs of infection or vascular compromise in the left upper extremity. She has some dependent edema. I've encouraged her to move her fingers however this is difficult given her memory/dementia issues.  Her abdomen is nontender lower extremity examination is benign without signs of DVT infection or vascular compromise. Chest is clear with equal expansion.   Assessment/Plan: STATUS POST LEFT WRIST AND LEFT HUMERUS FRACTURE PROXIMALLY. RECOMMEND SOCIAL WORKER CONSULT FOR PLACEMENT. GIVEN THE PATIENT'S DECLINING ABILITIES I WOULD CERTAINLY RECOMMEND CONSIDERATION FOR LONG-TERM PLACEMENT.   will continue elevation range of motion. Her IV came out and [place] I  would recommend that we hold off on further IV access go to by mouth medicines and monitor her. Hopefully we can find some degree of placement for her soon. We will watch her fractures closely but for now plan for a conservative nonsurgical approach which the family is in agreement with.  It was a pleasure to see her today she's very pleasant at this time and stable   Dereck Agerton III,Kingson Lohmeyer M 02/03/2013, 12:26 PM

## 2013-02-03 NOTE — Evaluation (Signed)
Clinical/Bedside Swallow Evaluation Patient Details  Name: MARILYNNE DUPUIS MRN: [id number] Date of Birth: July 04, 1921  Today's Date: 02/03/2013 Time: 7846-9629 SLP Time Calculation (min): 13 min  Past Medical History:  Past Medical History  Diagnosis Date  . Alzheimer's dementia   . Recurrent falls   . Compression fracture    Past Surgical History:  Past Surgical History  Procedure Laterality Date  . Wrist surgery Right   . Cataract extraction, bilateral     HPI:  The patient is an 79 yo F with history of Alzheimer's dementia and recurrent falls who presents with mechanical fall resulting in left radial, ulnar, and humeral fractures which have been splinted by Orthopedics.  The patient has dementia and contributes little to the history.  Per daughter-in-law, Ms. Rettke has had dementia for several years and has been ambulatory, living in her home with 24 hour caregivers.  Over the last several months, her confusion has gotten worse and she has become incontinent, become completely dependent for ADLs except she is still able to feed herself albeit slowly.  She has not had any problems with recurrent infectious such as pneumonia or urinary tract infections and has otherwise been healthy.  Her family is considering transitioning to SNF at this time because with her arm splinted, she will require additional care that the home caregiver may not be able to provide.     Assessment / Plan / Recommendation Clinical Impression  Pt does not evidence any signs of aspiration. Consumption of liquids and solids WFL despite confusion. Family at bedside confirm that pt does not have difficulty eating or drinking. Continue regular diet with thin liquids, no SLP f/u needed.     Aspiration Risk  Mild    Diet Recommendation Regular;Thin liquid   Liquid Administration via: Cup;Straw Medication Administration: Whole meds with liquid Supervision: Patient able to self feed Compensations: Slow rate;Small  sips/bites Postural Changes and/or Swallow Maneuvers: Seated upright 90 degrees    Other  Recommendations Oral Care Recommendations: Oral care BID   Follow Up Recommendations  None    Frequency and Duration        Pertinent Vitals/Pain NA    SLP Swallow Goals     Swallow Study Prior Functional Status       General HPI: The patient is an 79 yo F with history of Alzheimer's dementia and recurrent falls who presents with mechanical fall resulting in left radial, ulnar, and humeral fractures which have been splinted by Orthopedics.  The patient has dementia and contributes little to the history.  Per daughter-in-law, Ms. Fulmore has had dementia for several years and has been ambulatory, living in her home with 24 hour caregivers.  Over the last several months, her confusion has gotten worse and she has become incontinent, become completely dependent for ADLs except she is still able to feed herself albeit slowly.  She has not had any problems with recurrent infectious such as pneumonia or urinary tract infections and has otherwise been healthy.  Her family is considering transitioning to SNF at this time because with her arm splinted, she will require additional care that the home caregiver may not be able to provide.   Type of Study: Bedside swallow evaluation Diet Prior to this Study: Regular;Thin liquids Temperature Spikes Noted: No Respiratory Status: Room air History of Recent Intubation: No Behavior/Cognition: Alert;Cooperative;Confused Oral Cavity - Dentition: Adequate natural dentition Self-Feeding Abilities: Able to feed self Patient Positioning: Upright in bed Baseline Vocal Quality: Clear Volitional Cough: Strong Volitional  Swallow: Unable to elicit    Oral/Motor/Sensory Function Overall Oral Motor/Sensory Function: Other (comment)   Ice Chips     Thin Liquid Thin Liquid: Within functional limits Presentation: Cup;Straw;Self Fed    Nectar Thick Nectar Thick Liquid: Not  tested   Honey Thick Honey Thick Liquid: Not tested   Puree Puree: Within functional limits   Solid   GO    Solid: Within functional limits      [facility], MA CCC-SLP [phone]  Claudine Mouton 02/03/2013,3:49 PM

## 2013-02-03 NOTE — Plan of Care (Signed)
Problem: Phase I Progression Outcomes Goal: Incision/dressings dry and intact Outcome: Not Applicable Date Met:  02/03/13 No surgery

## 2013-02-03 NOTE — Progress Notes (Signed)
Pt has become combative and would not let me assess her hand or wrist to check for swelling and neurovascular checks. She is also refusing to take meds. Family called and made aware of pt's status. 0.5 haldol has been giving. Pt is resting comfortably.

## 2013-02-03 NOTE — Progress Notes (Signed)
[facility] PROGRESS NOTE  Peggy Pratt ZOX:[id number] DOB: 1920/09/19 DOA: 02/02/2013 PCP: Ailene Ravel, MD  Assessment/Plan: Recurrent falls, likely due to progressive dementia  - PT/OT evaluations  - Falls precautions  -will need SNF at discharge and 24 hours assistance and care at home.  Alzheimer's dementia with history of sundowning  - B12, TSH, RPR and vit D pending - Continue home medications  - Add risperidone QHS and PRN haldol by mouth or IM  - Frequent reorientation  - Minimize sedating medications (narcotics and benzos)   Heart murmur, currently asymptomatic  - Outpatient follow up   Infiltrate vs. Atelectasis on CXR. Afebrile without leukocytosis and asymptomatic. Likely atelectasis, however, cannot exclude possibility of silent aspiration given progressive dementia.  -patient remains afebrile -WBC's WNL and no cough or SOB. - SPL has found no difficulty with swallowing or signs of aspiration. - OOB and frequent ambulation  - continue IS   Humerus fracture, left  Left ulnar fracture  Left radial fracture  - Management per orthopedics/primary service - follow Vitamin D level   Thrombocytopenia, mild and likely due to fractures and stress - platelets now back to WNL  Code Status: DNR Family Communication: no family at bedside Disposition Plan: PT is recommending SNF at discharge for rehab   Antibiotics:  none  HPI/Subjective: Afebrile, feeling better overall. Able to follow simple commands.  Objective: Filed Vitals:   02/02/13 1400 02/02/13 2200 02/03/13 0500 02/03/13 1420  BP: 135/71 116/71 112/73 112/69  Pulse: 71 73 67 70  Temp: 98.6 F (37 C) 98.5 F (36.9 C) 97.9 F (36.6 C) 98.4 F (36.9 C)  TempSrc: Oral Oral Axillary Oral  Resp: 12 18 18 18   SpO2: 98% 98%  98%    Intake/Output Summary (Last 24 hours) at 02/03/13 1555 Last data filed at 02/03/13 1045  Gross per 24 hour  Intake    360 ml  Output    200 ml  Net    160 ml    Exam:   General:  Afebrile, NAD; oriented X1  Cardiovascular: S1 and S2, positive SEM, no rubs or gallops  Respiratory: CTA bilaterally  Abdomen: soft, NT, ND, positive BS  Musculoskeletal: no LE edema, LUE with cast in place and sling; reports having pain and difficulty moving her arm  Data Reviewed: Basic Metabolic Panel:  Recent Labs Lab 02/02/13 0938  NA 138  K 3.9  CL 103  CO2 25  GLUCOSE 127*  BUN 18  CREATININE 0.77  CALCIUM 8.7   CBC:  Recent Labs Lab 02/02/13 0938 02/03/13 1212  WBC 10.1 7.9  HGB 12.1 11.5*  HCT 37.0 36.1  MCV 89.4 90.7  PLT 146* 158    Studies: Dg Chest 1 View  02/02/2013   *RADIOLOGY REPORT*  Clinical Data: Wrist injury.  CHEST - 1 VIEW  Comparison: 11/22/2010  Findings: Lungs are hypoinflated with mild left base opacification suggesting a small amount of left pleural fluid with associated atelectasis, although cannot exclude infection.  Mild right base opacification which may be due to atelectasis versus infection. Borderline cardiomegaly.  Mediastinum otherwise unremarkable. Diffuse decreased bone density.  There is a displaced left humeral neck fracture which may be acute.  IMPRESSION: Hypoinflation with bibasilar opacification left worse than right suggesting atelectasis versus infection.  Possible small amount of left pleural fluid.  Displaced left humeral neck fracture which may be acute. Recommend clinical correlation.   Original Report Authenticated By: Elberta Fortis, M.D.   Dg Forearm Left  02/02/2013   *RADIOLOGY REPORT*  Clinical Data: Fall with wrist pain  LEFT FOREARM - 2 VIEW  Comparison: None  Findings: Comminuted fracture distal radius extending into the wrist joint.  Fracture of the styloid does not show significant displacement.  Proximal radius and ulna are intact.  IMPRESSION: Comminuted intra-articular fracture distal radius with dorsal displacement.  Minimally-displaced fracture distal ulna.   Original Report  Authenticated By: Janeece Riggers, M.D.   Dg Wrist Complete Left  02/02/2013   *RADIOLOGY REPORT*  Clinical Data: Fall  LEFT WRIST - COMPLETE 3+ VIEW  Comparison: none  Findings: Comminuted fracture distal radius extending into the wrist joint.  There is dorsal displacement and angulation. Fracture of distal ulna with mild dorsal displacement.  IMPRESSION: Comminuted intra-articular fracture distal radius with dorsal displacement.  There is also a fracture of the distal ulna with dorsal displacement.   Original Report Authenticated By: Janeece Riggers, M.D.    Scheduled Meds: . citalopram  10 mg Oral Daily  . donepezil  10 mg Oral QHS  . Memantine HCl ER  28 mg Oral QHS  . risperiDONE  0.5 mg Oral QHS  . tuberculin  5 Units Intradermal Once   Continuous Infusions:   Principal Problem:   Recurrent falls Active Problems:   Alzheimer's dementia   Heart murmur   Humerus fracture, left   Left ulnar fracture   Left radial fracture   Thrombocytopenia, unspecified    Time spent: >30 minutes   [name]  [facility] Pager [phone]. If 7PM-7AM, please contact night-coverage at [email or web address], password [facility] 02/03/2013, 3:55 PM  LOS: 1 day

## 2013-02-03 NOTE — Progress Notes (Signed)
Requested labs to be rescheduled to 0800 to keep from disturbing pt who has been resting comfortably since given dose of haldol at 0018.

## 2013-02-03 NOTE — Progress Notes (Signed)
OT Cancellation Note  Patient Details Name: Peggy Pratt MRN: [id number] DOB: 1921-05-15   Cancelled Treatment:    Reason Eval/Treat Not Completed: Pt working with SLP (just finished with PT), then fatigued.  Will reattempt tomorrow  Jeani Hawking M 02/03/2013, 5:12 PM

## 2013-02-04 MED ORDER — CALCIUM CARBONATE-VITAMIN D 500-200 MG-UNIT PO TABS
1.0000 | ORAL_TABLET | Freq: Two times a day (BID) | ORAL | Status: DC
  Administered 2013-02-05: 1 via ORAL
  Filled 2013-02-04 (×3): qty 1

## 2013-02-04 NOTE — Progress Notes (Signed)
Clinical Social Work Department CLINICAL SOCIAL WORK PLACEMENT NOTE 02/04/2013  Patient:  KAYLEY, ZEIDERS  Account Number:  [id number] Admit date:  02/02/2013  Clinical Social Worker:  Doroteo Glassman  Date/time:  02/04/2013 11:38 AM  Clinical Social Work is seeking post-discharge placement for this patient at the following level of care:   SKILLED NURSING   (*CSW will update this form in Epic as items are completed)   02/04/2013  Patient/family provided with [facility] Department of Clinical Social Work's list of facilities offering this level of care within the geographic area requested by the patient (or if unable, by the patient's family).  02/04/2013  Patient/family informed of their freedom to choose among providers that offer the needed level of care, that participate in Medicare, Medicaid or managed care program needed by the patient, have an available bed and are willing to accept the patient.  02/04/2013  Patient/family informed of [facility]' ownership interest in [facility], as well as of the fact that they are under no obligation to receive care at this facility.  PASARR submitted to EDS on 02/04/2013 PASARR number received from EDS on 02/04/2013  FL2 transmitted to all facilities in geographic area requested by pt/family on  02/04/2013 FL2 transmitted to all facilities within larger geographic area on   Patient informed that his/her managed care company has contracts with or will negotiate with  certain facilities, including the following:     Patient/family informed of bed offers received:   Patient chooses bed at  Physician recommends and patient chooses bed at    Patient to be transferred to  on   Patient to be transferred to facility by   The following physician request were entered in Epic:   Additional Comments:  Providence Crosby, Theresia Majors Clinical Social Work [phone]

## 2013-02-04 NOTE — Progress Notes (Signed)
Clinical Social Work Department BRIEF PSYCHOSOCIAL ASSESSMENT 02/04/2013  Patient:  Peggy Pratt, Peggy Pratt     Account Number:  [id number]     Admit date:  02/02/2013  Clinical Social Worker:  Doroteo Glassman  Date/Time:  02/04/2013 11:33 AM  Referred by:  Physician  Date Referred:  02/04/2013 Referred for  SNF Placement   Other Referral:   Interview type:  Other - See comment Other interview type:   Pt's daughter-in-law, Shearer    PSYCHOSOCIAL DATA Living Status:  ALONE Admitted from facility:   Level of care:   Primary support name:  Shearer Primary support relationship to patient:  CHILD, ADULT Degree of support available:   strong    CURRENT CONCERNS Current Concerns  Post-Acute Placement   Other Concerns:    SOCIAL WORK ASSESSMENT / PLAN Met with Pt's family to discuss d/c plans.    Per [employer], Pt has been living at home with round-the-clock caregivers for the past 3 years.  The family understands that Pt needs a higher level of care and are interested in rehab as a 1st step in this transition.    The family is interested in [facility] and are hopeful for a bed at this facility.    CSW thanked Scientist, clinical (histocompatibility and immunogenetics) for her time.   Assessment/plan status:  Psychosocial Support/Ongoing Assessment of Needs Other assessment/ plan:   Information/referral to community resources:   [facility] SNF list    PATIENT'S/FAMILY'S RESPONSE TO PLAN OF CARE: Family understands that Pt cannot live in her own home any longer and are coping well with changes that they anticipate happening soon.    Family thanked CSW for time and assistance.   Providence Crosby, LCSWA Clinical Social Work [phone]

## 2013-02-04 NOTE — Progress Notes (Signed)
Physical Therapy Treatment Patient Details Name: Peggy Pratt MRN: [id number] DOB: [birth year] Today's Date: 02/04/2013 Time: 4098-1191 PT Time Calculation (min): 20 min  PT Assessment / Plan / Recommendation  History of Present Illness The patient and 79 year old female who presented to the emergency room earlier this morning after a fall she sustained at home. She has a history of progressive dementia and does have full-time care at home as she is unable to perform activities of daily living. Earlier this morning she tripped and fell noting pain and difficulties with the left upper extremity. She was brought to the emergency room for initial evaluation. She was found to have a highly comminuted displaced distal radius fracture  and proximal humerus fx about the left upper extremity.   PT Comments   Pt much improved this session, cooperative but still presents with functional limitations and as a fall risk, will benefit from STSNF  Follow Up Recommendations  SNF     Does the patient have the potential to tolerate intense rehabilitation     Barriers to Discharge        Equipment Recommendations       Recommendations for Other Services    Frequency Min 3X/week   Progress towards PT Goals Progress towards PT goals: Progressing toward goals  Plan Current plan remains appropriate    Precautions / Restrictions Precautions Precautions: Fall Type of Shoulder Precautions: pt is in long arm cast at approximately 90 degrees elbow flexion--non-surgical management of LUE fxs Shoulder Interventions: Shoulder sling/immobilizer Restrictions Weight Bearing Restrictions: Yes LUE Weight Bearing: Non weight bearing   Pertinent Vitals/Pain Denies   Mobility  Bed Mobility Bed Mobility: Not assessed Supine to Sit: 1: +2 Total assist Supine to Sit: Patient Percentage: 40% Details for Bed Mobility Assistance: +2 for safety. use of pad to scoot to EOB. pt able to initiate scooting and needed cues  to self assist with R UE on bed.  Transfers Transfers: Sit to Stand;Stand to Sit Sit to Stand: 4: Min assist;From chair/3-in-1 Sit to Stand: Patient Percentage: 60% Stand to Sit: 4: Min assist;To chair/3-in-1 Stand to Sit: Patient Percentage: 60% Details for Transfer Assistance: assist for balance, pt initally unsteady; multi-modal cues for safety Ambulation/Gait Ambulation/Gait Assistance: 4: Min assist Ambulation Distance (Feet): 220 Feet Assistive device: 1 person hand held assist Ambulation/Gait Assistance Details: min assist for balance during gait Gait Pattern: Step-through pattern    Exercises Other Exercises Other Exercises: flexion and extension of digits left hand, gentle retrograde to fingers due to immense edema hand and fingers; attempted to elevate hand slightly with pt in chair   PT Diagnosis:    PT Problem List:   PT Treatment Interventions:     PT Goals (current goals can now be found in the care plan section) Acute Rehab PT Goals Patient Stated Goal: to be able to transfer and walk, move digits Time For Goal Achievement: 02/17/13 Potential to Achieve Goals: Good  Visit Information  Last PT Received On: 02/04/13 Assistance Needed: +2 History of Present Illness: The patient and 79 year old female who presented to the emergency room earlier this morning after a fall she sustained at home. She has a history of progressive dementia and does have full-time care at home as she is unable to perform activities of daily living. Earlier this morning she tripped and fell noting pain and difficulties with the left upper extremity. She was brought to the emergency room for initial evaluation. She was found to have a highly comminuted displaced distal  radius fracture  and proximal humerus fx about the left upper extremity.    Subjective Data  Subjective: pt up in chair Patient Stated Goal: to be able to transfer and walk, move digits   Cognition  Cognition Arousal/Alertness:  Awake/alert Behavior During Therapy: WFL for tasks assessed/performed Overall Cognitive Status: History of cognitive impairments - at baseline Following Commands: Follows one step commands with increased time Safety/Judgement: Decreased awareness of safety;Decreased awareness of deficits    Balance     End of Session PT - End of Session Equipment Utilized During Treatment: Gait belt;Other (comment) (shoulder immobilizer) Activity Tolerance: Patient tolerated treatment well Patient left: in chair;with call bell/phone within reach Nurse Communication: Mobility status   GP     [facility] 02/04/2013, 2:19 PM

## 2013-02-04 NOTE — Progress Notes (Signed)
[facility] PROGRESS NOTE  Peggy Pratt YNW:[id number] DOB: Jun 10, 1921 DOA: 02/02/2013 PCP: Ailene Ravel, MD  Assessment/Plan: Recurrent falls, likely due to progressive dementia  - PT/OT evaluations has recommended short term SNF for rehab and conditioning -Falls precautions  -24 hours assistance and care at home after SNF; vs permanent nursing facility placement..  Alzheimer's dementia with history of sundowning and behavioral disturbances - B12, TSH and RPR WNL -vit D pending (results can be follow by PCP as an outpatient); will start basic calcium and vit D supplementation - Continue home medications (donepezil and namenda) - Add risperidone QHS and PRN haldol by mouth or IM  - Frequent reorientation  - Minimize sedating medications (narcotics and benzos) -continue celexa   Heart murmur, currently asymptomatic  - Outpatient follow up (perphas 2-D echo if needed; PCP to decide)   Infiltrate vs. Atelectasis on CXR. Afebrile without leukocytosis and asymptomatic. Likely atelectasis -patient remains afebrile -WBC's WNL and no cough or SOB. - SPL has found no difficulty with swallowing or signs of aspiration. - OOB and frequent ambulation  - continue IS   Left Humerus fracture, Left ulnar fracture, Left radial fracture  - Management per orthopedics/primary service - follow Vitamin D level   Thrombocytopenia, mild and likely due to fractures and stress - platelets now back to WNL  Code Status: DNR Family Communication: no family at bedside Disposition Plan: PT is recommending SNF at discharge for rehab. Per IM stand point patient ready for discharge. No further work up anticipated as inpatient. Continue current medication regimen. Will sign off; call us with questions.  Antibiotics:  none  HPI/Subjective: Afebrile, feeling better overall. Patient AAOX2 (most likely baseline). NAD and participating with PT  Objective: Filed Vitals:   02/03/13 2252 02/04/13  0624 02/04/13 1500 02/04/13 2150  BP: 128/71 141/74 108/64 106/67  Pulse: 67 73 72 76  Temp: 99.4 F (37.4 C) 98.5 F (36.9 C) 98.7 F (37.1 C) 98.7 F (37.1 C)  TempSrc: Oral Oral  Oral  Resp: 18 18 16 16   SpO2: 98% 97% 93% 93%    Intake/Output Summary (Last 24 hours) at 02/04/13 2220 Last data filed at 02/04/13 1700  Gross per 24 hour  Intake    720 ml  Output      0 ml  Net    720 ml   Exam:   General:  Afebrile, NAD; oriented X2 (probably baseline)  Cardiovascular: S1 and S2, positive SEM, no rubs or gallops  Respiratory: CTA bilaterally  Abdomen: soft, NT, ND, positive BS  Musculoskeletal: no LE edema, LUE with cast in place and sling; reports having minimal pain  Data Reviewed: Basic Metabolic Panel:  Recent Labs Lab 02/02/13 0938  NA 138  K 3.9  CL 103  CO2 25  GLUCOSE 127*  BUN 18  CREATININE 0.77  CALCIUM 8.7   CBC:  Recent Labs Lab 02/02/13 0938 02/03/13 1212  WBC 10.1 7.9  HGB 12.1 11.5*  HCT 37.0 36.1  MCV 89.4 90.7  PLT 146* 158    Studies: No results found.  Scheduled Meds: . citalopram  10 mg Oral Daily  . donepezil  10 mg Oral QHS  . Memantine HCl ER  28 mg Oral QHS  . risperiDONE  0.5 mg Oral QHS  . tuberculin  5 Units Intradermal Once   Continuous Infusions:   Principal Problem:   Recurrent falls Active Problems:   Alzheimer's dementia   Heart murmur   Humerus fracture, left   Left  ulnar fracture   Left radial fracture   Thrombocytopenia, unspecified    Time spent: >30 minutes   Jorrell Kuster  [facility] Pager [phone]. If 7PM-7AM, please contact night-coverage at [email or web address], password [facility] 02/04/2013, 10:20 PM  LOS: 2 days

## 2013-02-04 NOTE — Evaluation (Signed)
Occupational Therapy Evaluation Patient Details Name: Peggy Pratt MRN: [id number] DOB: 06/20/1921 Today's Date: 02/04/2013 Time: 4098-1191 OT Time Calculation (min): 24 min  OT Assessment / Plan / Recommendation History of present illness The patient and 79 year old female who presented to the emergency room earlier this morning after a fall she sustained at home. She has a history of progressive dementia and does have full-time care at home as she is unable to perform activities of daily living. Earlier this morning she tripped and fell noting pain and difficulties with the left upper extremity. She was brought to the emergency room for initial evaluation. She was found to have a highly comminuted displaced distal radius fracture  and proximal humerus fx about the left upper extremity.   Clinical Impression   Pt presents with L UE limitations due to fractures and overall a decrease in functional mobility and ADL. She will benefit from continued OT services for ROM and ADL education to increase independence/decrease caregiver burden for next venue of care.    OT Assessment  Patient needs continued OT Services    Follow Up Recommendations  SNF;Supervision/Assistance - 24 hour    Barriers to Discharge      Equipment Recommendations  3 in 1 bedside comode    Recommendations for Other Services    Frequency  Min 2X/week    Precautions / Restrictions Precautions Precautions: Fall Type of Shoulder Precautions: pt is in long arm cast at approximately 90 degrees elbow flexion--non-surgical management of LUE fxs Shoulder Interventions: Shoulder sling/immobilizer Restrictions Weight Bearing Restrictions: Yes LUE Weight Bearing: Non weight bearing   Pertinent Vitals/Pain No c/o    ADL  Eating/Feeding: Simulated;Set up Where Assessed - Eating/Feeding: Chair Grooming: Simulated;Wash/dry face;Supervision/safety;Set up Where Assessed - Grooming: Supported sitting Upper Body Bathing:  Simulated;Chest;Right arm;Left arm;Abdomen;Maximal assistance Where Assessed - Upper Body Bathing: Unsupported sitting Lower Body Bathing: Simulated;+2 Total assistance Lower Body Bathing: Patient Percentage: 30% Where Assessed - Lower Body Bathing: Supported sit to stand Upper Body Dressing: Simulated;+1 Total assistance Where Assessed - Upper Body Dressing: Unsupported sitting Lower Body Dressing: Simulated;+2 Total assistance Lower Body Dressing: Patient Percentage: 10% Where Assessed - Lower Body Dressing: Supported sit to stand Toilet Transfer: Simulated;+2 Total assistance Toilet Transfer: Patient Percentage: 60% Statistician Method: Stand pivot Toileting - Architect and Hygiene: Simulated;+2 Total assistance Toileting - Architect and Hygiene: Patient Percentage: 0% Where Assessed - Toileting Clothing Manipulation and Hygiene: Standing ADL Comments: Pt's daugther present and states that pt had 24/7 help at home with ADL. She could feed herself and assist with toilet hygiene and clothing management. She needed quite a bit of assist with dressing/bathing at home. Educated on ROM of digits for edema control as pt has significant  edema in digits. Not able to fully extend or flex on her own. elevated digits with towel roll and placed pillow under UE.     OT Diagnosis: Generalized weakness  OT Problem List: Decreased strength;Decreased range of motion;Decreased knowledge of use of DME or AE;Decreased knowledge of precautions;Increased edema OT Treatment Interventions: Self-care/ADL training;DME and/or AE instruction;Therapeutic activities;Therapeutic exercise   OT Goals(Current goals can be found in the care plan section) Acute Rehab OT Goals Patient Stated Goal: to be able to transfer and walk, move digits OT Goal Formulation: With family Time For Goal Achievement: 02/11/13 Potential to Achieve Goals: Good  Visit Information  Last OT Received On:  02/04/13 Assistance Needed: +2 History of Present Illness: The patient and 79 year old female who presented to  the emergency room earlier this morning after a fall she sustained at home. She has a history of progressive dementia and does have full-time care at home as she is unable to perform activities of daily living. Earlier this morning she tripped and fell noting pain and difficulties with the left upper extremity. She was brought to the emergency room for initial evaluation. She was found to have a highly comminuted displaced distal radius fracture  and proximal humerus fx about the left upper extremity.       Prior Functioning     Home Living Family/patient expects to be discharged to:: Skilled nursing facility Living Arrangements: Alone Communication Communication: No difficulties         Vision/Perception     Cognition  Cognition Arousal/Alertness: Awake/alert Overall Cognitive Status: History of cognitive impairments - at baseline    Extremity/Trunk Assessment Upper Extremity Assessment Upper Extremity Assessment: LUE deficits/detail LUE Deficits / Details: pt in long arm cast, sling applied. edema noted in digits. not able to fully actively flex/extend on her own. AAROM to digits X about 10 reps. Elevated L UE on pillow in sling and a towel roll under digits. Encouraged ROM and family in room aware.      Mobility Bed Mobility Bed Mobility: Supine to Sit Supine to Sit: 1: +2 Total assist Supine to Sit: Patient Percentage: 40% Details for Bed Mobility Assistance: +2 for safety. use of pad to scoot to EOB. pt able to initiate scooting and needed cues to self assist with R UE on bed.  Transfers Transfers: Sit to Stand;Stand to Sit Sit to Stand: 1: +2 Total assist;From bed Sit to Stand: Patient Percentage: 60% Stand to Sit: 1: +2 Total assist;To chair/3-in-1 Stand to Sit: Patient Percentage: 60% Details for Transfer Assistance: assist to help rise and stabilize and  control descent to chair. Needed manual assist to reach for the chair on R. Pt stood well with assist for pad to be straightened under her by daugther. No buckling of LEs.     Exercise Other Exercises Other Exercises: AAROM to L digits. pt unable to fully flex/extend on her own and difficulty following commands due to cognition. family present in room for education. elevated digits on towel roll.    Balance     End of Session OT - End of Session Equipment Utilized During Treatment: Gait belt Activity Tolerance: Patient tolerated treatment well Patient left: in chair;with call bell/phone within reach;with family/visitor present  GO     Lennox Laity [phone] 02/04/2013, 11:34 AM

## 2013-02-04 NOTE — Progress Notes (Signed)
Subjective: Patient is alert. She is comfortable. She has no signs of infection. The patient I discussed all issues at length. She had relatively good night last night. She is here today. I discussed all issues with her daughter and social work.  Objective: Vital signs in last 24 hours: Temp:  [98.4 F (36.9 C)-99.4 F (37.4 C)] 98.5 F (36.9 C) (08/03 0624) Pulse Rate:  [67-73] 73 (08/03 0624) Resp:  [18] 18 (08/03 0624) BP: (112-141)/(69-74) 141/74 mmHg (08/03 0624) SpO2:  [97 %-98 %] 97 % (08/03 0624)  Intake/Output from previous day: 08/02 0701 - 08/03 0700 In: 420 [P.O.:420] Out: -  Intake/Output this shift:     Recent Labs  02/02/13 0938 02/03/13 1212  HGB 12.1 11.5*    Recent Labs  02/02/13 0938 02/03/13 1212  WBC 10.1 7.9  RBC 4.14 3.98  HCT 37.0 36.1  PLT 146* 158    Recent Labs  02/02/13 0938  NA 138  K 3.9  CL 103  CO2 25  BUN 18  CREATININE 0.77  GLUCOSE 127*  CALCIUM 8.7    Recent Labs  02/02/13 1442  INR 1.12    Physical exam: Patient has stable HEENT appearance. She has no signs of infection. She moves her fingers relatively well with some degree of dependent edema secondary to the fracture process. She is relatively comfortable in the left upper extremity and there no complicating features today. Abdomen is nontender chest is clear. Lower extremity examination is benign.  Assessment/Plan: Recommend placement for this patient. Given her age as well as other mitigating medical factors refill long-term placement would be in her best interest. The family is aware pleased with his decision. We're looking for a skilled facility for.  I feel she is stable at this juncture for discharge and will accommodate the DC once placement is determined.  I do feel she'll have some degree of mild collapse about the radius but I would live with this given her level of function and her cognitive and physical abilities. Patient understands this. Family  understands this.    Remington Skalsky III,Nahla Lukin M 02/04/2013, 11:11 AM

## 2013-02-05 LAB — VITAMIN D 25 HYDROXY (VIT D DEFICIENCY, FRACTURES): Vit D, 25-Hydroxy: 36 ng/mL (ref 30–89)

## 2013-02-05 NOTE — Discharge Summary (Signed)
Physician Discharge Summary  Patient ID: Peggy Pratt MRN: [id number] DOB/AGE: Jul 23, 1920 79 y.o.  Admit date: 02/02/2013 Discharge date: 02/05/2013  Admission Diagnoses: Status post fall with a comminuted displaced left distal radius fracture as well as left proximal humerus fracture Patient Active Problem List   Diagnosis Date Noted  . Heart murmur 02/02/2013  . Humerus fracture, left 02/02/2013  . Left ulnar fracture 02/02/2013  . Left radial fracture 02/02/2013  . Thrombocytopenia, unspecified 02/02/2013  . Alzheimer's dementia   . Recurrent falls     Discharge Diagnoses:  Principal Problem:   Recurrent falls Active Problems:   Alzheimer's dementia   Heart murmur   Humerus fracture, left   Left ulnar fracture   Left radial fracture   Thrombocytopenia, unspecified   Discharged Condition: improved   Hospital Course: The patient is an 79 year old female with a history of progressive dementia who currently lives with 24 7 assisted care at home. She has had unfortunately her worsening of her dementia and at this juncture requires for supportive care in terms of all activities of daily living. She unfortunately fell Friday while at home he was seen and evaluated in the emergency room setting. Patient was noted to have a comminuted displaced left distal radius fracture as well as left humerus fracture. See and evaluate the patient and discussed with her family at length nature upper extremity predicament. Certainly given the fracture pattern of the left wrist operative intervention would be best in terms of stabilizing the bone and to prevent traumatic arthritis and stiffness, however, given the patient's overall progressive worsening dementia and as on her initial evaluation by as she was found to have a new murmur, asymptomatic schedule the family attempts at conservative measures in terms of a close reduction of the wrist and conservative measures for the left proximal humerus  fracture. The family was in certain agreements after conservative measures. The patient underwent closed reduction of left distal radius with overall improved alignment. She was placed into a long-arm cast and a sling she was admitted and [facility] was consulted .recommendations for outpatient followup with a new murmur as she was asymptomatic were made in addition her chest x-ray the time of admission showed a potential fluid that rule out atelectasis versus examination of the hospitalist this did not appear to be an infection given her normal white cell count and lack of symptomatology. The patient was admitted she did have episodes of agitation primarily at night consistent with sundowner syndrome, overall she remained stable and had no complications during the course of her admit. Given the fact she has fractures of the left wrist and humerus and history of multiple falls we recommend evaluation for PT and OT who noted to recommend the patient discharging her short-term nursing facility. Case care management was implemented and the patient have an availability to proceed to [facility]. Evaluation on 02/05/2013 showed that the patient is pleasantly demented. Is not oriented person place or time. She does follow commands. Violation of the left upper extremity showed that she had dorsal edema about the hand and digits which is pinned it in nature and lack of mobility. Elevation and massage of the digits the swelling goes down completely. I did trim the cast to allow her more finger range of motion. Upon her discharge it will be in the neck back therapy at her nursing facility works aggressively on range of motion of the digits both active and passive in addition the nursing staff should check  her hand frequently to ensure that she is elevating this and she will need passive massage and motion to prevent swelling. He will be nonweightbearing to the left upper extremity she will need a sling on for  any attempting ambulation with assistance. She can come out of the sling for hygienic purposes with baths etc. We will need to see the patient week to 10 days with repeat radiographs.   Consults: [facility]   Treatments: Closed treduction and casting left distal radius fracture with closed treatment left proximal humerus   Discharge Exam: Blood pressure 112/70, pulse 67, temperature 98 F (36.7 C), temperature source Oral, resp. rate 18, SpO2 94.00%. The patient is pleasantly demented she is sitting up in a chair and finishing lunch   head atraumatic Chest her chest equal expansions present respirations are nonlabored Abdomen is nontender Left upper extremity shows that she has dorsal edema of the hand and digits with ecchymosis present with massage and passive range of motion is improved, her cast is trimmed to allow better range of motion, sensation refill are intact. The cast is clean and intact. Sling is in place  Disposition:  [facility]  Discharge Orders   Future Appointments Provider Department Dept Phone   02/23/2013 9:30 AM Huston Foley, MD [facility] [phone]   Future Orders Complete By Expires     Call MD / Call 911  As directed     Comments:      If you experience chest pain or shortness of breath, CALL 911 and be transported to the hospital emergency room.  If you develope a fever above 101 F, pus (white drainage) or increased drainage or redness at the wound, or calf pain, call your surgeon's office.    Constipation Prevention  As directed     Comments:      Drink plenty of fluids.  Prune juice may be helpful.  You may use a stool softener, such as Colace (over the counter) 100 mg twice a day.  Use MiraLax (over the counter) for constipation as needed.    Diet - low sodium heart healthy  As directed     Discharge instructions  As directed     Comments:      Elevate, left hand frequently and support forearm, elbow and shoulder with  pillows. Patient will need passive range of motion to the fingers as well as massage of the dorsal aspect of the hand each hour 10-20 times an hour to prevent swelling of the dorsal hand. She will be nonweightbearing to the left upper extremity given the distal radius fracture as well as proximal humerus fracture. She should be in a sling at all times except for hygienic purposes with baths. A safe position for the proximal humerus fractures with the patient on across her belly.    Increase activity slowly as tolerated  As directed         Medication List         cholecalciferol 1000 UNITS tablet  Commonly known as:  VITAMIN D  Take 1,000 Units by mouth daily.     citalopram 20 MG tablet  Commonly known as:  CELEXA  Take 10 mg by mouth daily.     donepezil 10 MG tablet  Commonly known as:  ARICEPT  Take 10 mg by mouth at bedtime as needed.     memantine 10 MG tablet  Commonly known as:  NAMENDA  Take 10 mg by mouth 2 (two) times daily.  multivitamin with minerals tablet  Take 1 tablet by mouth daily.           Follow-up Information   Follow up with Karen Chafe, MD. Schedule an appointment as soon as possible for a visit in 10 days.   Contact information:   [address] [place] [address] [phone]       Signed: Sheran Lawless 02/05/2013, 12:28 PM

## 2013-02-05 NOTE — Progress Notes (Signed)
Occupational Therapy Treatment Patient Details Name: Peggy Pratt MRN: [id number] DOB: August 15, 1920 Today's Date: 02/05/2013 Time: 2952-8413 OT Time Calculation (min): 31 min  OT Assessment / Plan / Recommendation  History of present illness The patient and 79 year old female who presented to the emergency room earlier this morning after a fall she sustained at home. She has a history of progressive dementia and does have full-time care at home as she is unable to perform activities of daily living. Earlier this morning she tripped and fell noting pain and difficulties with the left upper extremity. She was brought to the emergency room for initial evaluation. She was found to have a highly comminuted displaced distal radius fracture  and proximal humerus fx about the left upper extremity.   OT comments  Pt tolerated L hand ROM exercises and did better with performing exercises but still needed multimodal cues. Pt also improving with her independence with functional transfers. Educated pt on edema control strategies but ? Follow through ability.   Follow Up Recommendations  SNF;Supervision/Assistance - 24 hour    Barriers to Discharge       Equipment Recommendations  3 in 1 bedside comode    Recommendations for Other Services    Frequency Min 2X/week   Progress towards OT Goals Progress towards OT goals: Progressing toward goals  Plan Discharge plan remains appropriate    Precautions / Restrictions Precautions Precautions: Fall Type of Shoulder Precautions: pt is in long arm cast at approximately 90 degrees elbow flexion--non-surgical management of LUE fxs Shoulder Interventions: Shoulder sling/immobilizer Restrictions Weight Bearing Restrictions: Yes LUE Weight Bearing: Non weight bearing   Pertinent Vitals/Pain No complaint of    ADL  Toilet Transfer: Simulated;Minimal assistance Toilet Transfer Method: Stand pivot ADL Comments: Noted sling loose and slid back on elbow.  Readjusted sling so that elbow fit into corner pocket better. Placed washcloth at neck as pt complaining of sling strap rubbing at neck. Then adjusted straps for better snug fit.     OT Diagnosis:    OT Problem List:   OT Treatment Interventions:     OT Goals(current goals can now be found in the care plan section) Acute Rehab OT Goals Potential to Achieve Goals: Good  Visit Information  Last OT Received On: 02/05/13 Assistance Needed: +1 History of Present Illness: The patient and 79 year old female who presented to the emergency room earlier this morning after a fall she sustained at home. She has a history of progressive dementia and does have full-time care at home as she is unable to perform activities of daily living. Earlier this morning she tripped and fell noting pain and difficulties with the left upper extremity. She was brought to the emergency room for initial evaluation. She was found to have a highly comminuted displaced distal radius fracture  and proximal humerus fx about the left upper extremity.    Subjective Data      Prior Functioning       Cognition  Cognition Arousal/Alertness: Awake/alert Behavior During Therapy: WFL for tasks assessed/performed Overall Cognitive Status: History of cognitive impairments - at baseline    Mobility  Bed Mobility Bed Mobility: Supine to Sit Supine to Sit: HOB elevated;3: Mod assist Details for Bed Mobility Assistance: used pad to help scoot pt around to EOB and scoot hips forward. Pt able to initiate moving hips better today also.  Transfers Transfers: Sit to Stand;Stand to Sit Sit to Stand: 4: Min assist;With upper extremity assist;From bed;Other (comment) (R UE use) Stand to  Sit: 4: Min assist;With upper extremity assist;To chair/3-in-1 Details for Transfer Assistance: assist to rise and balance. Pt able to initiate movement but needs multimodal cues for safety and hand over hand assist to find armrest of chair on R to help  sit down.     Exercises  Other Exercises Other Exercises: Note purplish color of hand where splint ends. Nursing in room and is aware. She states she will let MD know and OT also left note for MD as well. Digit color looks more normal. Purplish color appeared to improve with ROM exercises but ? if splint is too tight. Pt able to perform at least 10 reps of digit flexion/extension for L hand, all digits. She needed multimodal cues to initiate and follow through. She improved with ROM on her own as session progressed. Elevated digits on washcloth for edema control and placed pillow under elbow and forearm.    Balance Balance Balance Assessed: Yes Static Sitting Balance Static Sitting - Balance Support: Feet supported Static Sitting - Level of Assistance: 5: Stand by assistance   End of Session OT - End of Session Activity Tolerance: Patient tolerated treatment well Patient left: in chair;with call bell/phone within reach;with family/visitor present;with chair alarm set  GO     Lennox Laity [phone] 02/05/2013, 10:26 AM

## 2013-02-05 NOTE — Progress Notes (Signed)
Clinical Social Work Department CLINICAL SOCIAL WORK PLACEMENT NOTE 02/05/2013  Patient:  Peggy Pratt, Peggy Pratt  Account Number:  [id number] Admit date:  02/02/2013  Clinical Social Worker:  Doroteo Glassman  Date/time:  02/04/2013 11:38 AM  Clinical Social Work is seeking post-discharge placement for this patient at the following level of care:   SKILLED NURSING   (*CSW will update this form in Epic as items are completed)   02/04/2013  Patient/family provided with [facility] Department of Clinical Social Work's list of facilities offering this level of care within the geographic area requested by the patient (or if unable, by the patient's family).  02/04/2013  Patient/family informed of their freedom to choose among providers that offer the needed level of care, that participate in Medicare, Medicaid or managed care program needed by the patient, have an available bed and are willing to accept the patient.  02/04/2013  Patient/family informed of [facility]' ownership interest in [facility], as well as of the fact that they are under no obligation to receive care at this facility.  PASARR submitted to EDS on 02/04/2013 PASARR number received from EDS on 02/04/2013  FL2 transmitted to all facilities in geographic area requested by pt/family on  02/04/2013 FL2 transmitted to all facilities within larger geographic area on   Patient informed that his/her managed care company has contracts with or will negotiate with  certain facilities, including the following:     Patient/family informed of bed offers received:  02/05/2013 Patient chooses bed at [facility], [place] Physician recommends and patient chooses bed at    [facility] [place] on  02/05/2013 Patient to be transferred to facility by FAMILY  The following physician request were entered in Epic:   Additional Comments:  Cori Razor LCSW  [phone]

## 2013-02-05 NOTE — Progress Notes (Signed)
Pt discharged at 1442 via wheelchair with family to be transported to rehab. Pt/Fm verbalized understanding d/c instructions and when to notify the Dr. Pt stable at time of d/c.

## 2013-02-05 NOTE — Progress Notes (Signed)
Pt had TB skin test that was performed on Sat. February 03, 2013 to her right anterior forearm; test site read today on Mon. February 05, 2013 and is a level 0 which is negative.  There is slight bruising noted.  Test has been read and confirmed by Georgia Lopes, RN, BSN.

## 2013-02-05 NOTE — Progress Notes (Signed)
FL2 / DNR in chart for MD signature. Pt has SNF bed at [facility] ( [facility] ) for today if stable for d/c. Family is aware and will transport pt. Family will talk to pt about going to rehab prior to d/c. CSW will continue to follow to assist with d/c planning.  Cori Razor [phone]

## 2013-02-05 NOTE — Progress Notes (Signed)
Physical Therapy Treatment Patient Details Name: Peggy Pratt MRN: [id number] DOB: 1920/09/22 Today's Date: 02/05/2013 Time: 5621-3086 PT Time Calculation (min): 26 min  PT Assessment / Plan / Recommendation  History of Present Illness The patient and 79 year old female who presented to the emergency room earlier this morning after a fall she sustained at home. She has a history of progressive dementia and does have full-time care at home as she is unable to perform activities of daily living. Earlier this morning she tripped and fell noting pain and difficulties with the left upper extremity. She was brought to the emergency room for initial evaluation. She was found to have a highly comminuted displaced distal radius fracture  and proximal humerus fx about the left upper extremity.   PT Comments   Pt OOB in recliner.  Assisted to standing and amb in hallway.  Assisted to BR to void and with hygiene as pt's L UE in cast.  Positioned in recliner with L hand elevated due to finger swelling.     Follow Up Recommendations  SNF     Does the patient have the potential to tolerate intense rehabilitation     Barriers to Discharge        Equipment Recommendations       Recommendations for Other Services    Frequency Min 3X/week   Progress towards PT Goals Progress towards PT goals: Progressing toward goals  Plan Current plan remains appropriate    Precautions / Restrictions Precautions Precautions: Fall Type of Shoulder Precautions: pt is in long arm cast at approximately 90 degrees elbow flexion--non-surgical management of LUE fxs Shoulder Interventions: Shoulder sling/immobilizer Restrictions Weight Bearing Restrictions: Yes LUE Weight Bearing: Non weight bearing    Pertinent Vitals/Pain No c/o pain    Mobility  Bed Mobility Bed Mobility: Not assessed Supine to Sit: HOB elevated;3: Mod assist Details for Bed Mobility Assistance: Pt OOB in recliner Transfers Transfers: Sit  to Stand;Stand to Sit Sit to Stand: 4: Min assist;With upper extremity assist;From bed;Other (comment) Stand to Sit: 4: Min assist;With upper extremity assist;To chair/3-in-1 Details for Transfer Assistance: assist to rise and balance. Pt able to initiate movement but needs multimodal cues for safety and hand over hand assist to find armrest of chair on R to help sit down.  Ambulation/Gait Ambulation/Gait Assistance: 4: Min assist;3: Mod assist Ambulation Distance (Feet): 175 Feet Assistive device: 1 person hand held assist Ambulation/Gait Assistance Details: Quick gait.  VC's for safety.  Pt has x1 LOB while turning around, therapist recovered.  Gait Pattern: Step-through pattern Gait velocity: too quick    Exercises Other Exercises Other Exercises: Note purplish color of hand where splint ends. Nursing in room and is aware. She states she will let MD know and OT also left note for MD as well. Digit color looks more normal. Purplish color appeared to improve with ROM exercises but ? if splint is too tight. Pt able to perform at least 10 reps of digit flexion/extension for L hand, all digits. She needed multimodal cues to initiate and follow through. She improved with ROM on her own as session progressed. Elevated digits on washcloth for edema control and placed pillow under elbow and forearm.    PT Goals (current goals can now be found in the care plan section)    Visit Information  Last PT Received On: 02/05/13 Assistance Needed: +1 History of Present Illness: The patient and 79 year old female who presented to the emergency room earlier this morning after a fall she  sustained at home. She has a history of progressive dementia and does have full-time care at home as she is unable to perform activities of daily living. Earlier this morning she tripped and fell noting pain and difficulties with the left upper extremity. She was brought to the emergency room for initial evaluation. She was found  to have a highly comminuted displaced distal radius fracture  and proximal humerus fx about the left upper extremity.    Subjective Data      Cognition  Cognition Arousal/Alertness: Awake/alert Behavior During Therapy: WFL for tasks assessed/performed Overall Cognitive Status: History of cognitive impairments - at baseline    Balance  Balance Balance Assessed: Yes Static Sitting Balance Static Sitting - Balance Support: Feet supported Static Sitting - Level of Assistance: 5: Stand by assistance  End of Session PT - End of Session Equipment Utilized During Treatment: Gait belt Activity Tolerance: Patient tolerated treatment well Patient left: in chair;with call bell/phone within reach Nurse Communication: Mobility status   Felecia Shelling  PTA [facility]  Acute  Rehab Pager      [phone]

## 2013-02-06 ENCOUNTER — Telehealth: Payer: Self-pay | Admitting: Neurology

## 2013-02-06 NOTE — Telephone Encounter (Signed)
Transfer of care Dr. Love needs to be reassigned per Dr. Athar °

## 2013-02-07 NOTE — Telephone Encounter (Signed)
Assigned to Dr. Dohmeier.  

## 2013-02-07 NOTE — Telephone Encounter (Signed)
Patient needs to be reassigned per Dr. Athar. 

## 2013-02-08 ENCOUNTER — Telehealth: Payer: Self-pay | Admitting: Neurology

## 2013-02-23 ENCOUNTER — Encounter: Payer: Self-pay | Admitting: Neurology

## 2013-02-23 NOTE — Telephone Encounter (Signed)
Pt came in didn't get msg, pt is in nursing home, check-in person got updated information on pt. Please call nursing home [phone] pt is in room 704, she has fallen and hurt her whole left side and needs to get in sooner before her apt in Dec. Pt was a transfer of care Dr. Sandria Manly, was set w/Dr. Frances Furbish now she has been reassigned to Dr. Vickey Huger. Thanks

## 2013-03-27 ENCOUNTER — Telehealth: Payer: Self-pay | Admitting: Neurology

## 2013-03-27 NOTE — Telephone Encounter (Signed)
I called and left a message for the patient to callback to the office to r/s her Dec. 24 appt. with Dr. Vickey Huger.

## 2013-04-16 ENCOUNTER — Encounter (HOSPITAL_COMMUNITY): Payer: Self-pay | Admitting: Radiology

## 2013-04-16 ENCOUNTER — Emergency Department (HOSPITAL_COMMUNITY): Payer: Medicare Other

## 2013-04-16 ENCOUNTER — Inpatient Hospital Stay (HOSPITAL_COMMUNITY)
Admission: EM | Admit: 2013-04-16 | Discharge: 2013-04-23 | DRG: 470 | Disposition: A | Discharge status: Skilled Nursing Facility | Payer: Medicare Other | Attending: Internal Medicine | Admitting: Internal Medicine

## 2013-04-16 DIAGNOSIS — G309 Alzheimer's disease, unspecified: Secondary | ICD-10-CM | POA: Diagnosis present

## 2013-04-16 DIAGNOSIS — F0280 Dementia in other diseases classified elsewhere without behavioral disturbance: Secondary | ICD-10-CM

## 2013-04-16 DIAGNOSIS — R404 Transient alteration of awareness: Secondary | ICD-10-CM | POA: Diagnosis present

## 2013-04-16 DIAGNOSIS — Y9210 Unspecified residential institution as the place of occurrence of the external cause: Secondary | ICD-10-CM | POA: Diagnosis present

## 2013-04-16 DIAGNOSIS — R011 Cardiac murmur, unspecified: Secondary | ICD-10-CM

## 2013-04-16 DIAGNOSIS — R4189 Other symptoms and signs involving cognitive functions and awareness: Secondary | ICD-10-CM

## 2013-04-16 DIAGNOSIS — M810 Age-related osteoporosis without current pathological fracture: Secondary | ICD-10-CM | POA: Diagnosis present

## 2013-04-16 DIAGNOSIS — T40605A Adverse effect of unspecified narcotics, initial encounter: Secondary | ICD-10-CM | POA: Diagnosis present

## 2013-04-16 DIAGNOSIS — R5381 Other malaise: Secondary | ICD-10-CM | POA: Diagnosis present

## 2013-04-16 DIAGNOSIS — S72002A Fracture of unspecified part of neck of left femur, initial encounter for closed fracture: Secondary | ICD-10-CM

## 2013-04-16 DIAGNOSIS — Z66 Do not resuscitate: Secondary | ICD-10-CM

## 2013-04-16 DIAGNOSIS — S72009A Fracture of unspecified part of neck of unspecified femur, initial encounter for closed fracture: Principal | ICD-10-CM | POA: Diagnosis present

## 2013-04-16 DIAGNOSIS — R296 Repeated falls: Secondary | ICD-10-CM

## 2013-04-16 DIAGNOSIS — W010XXA Fall on same level from slipping, tripping and stumbling without subsequent striking against object, initial encounter: Secondary | ICD-10-CM | POA: Diagnosis present

## 2013-04-16 DIAGNOSIS — R1310 Dysphagia, unspecified: Secondary | ICD-10-CM | POA: Diagnosis present

## 2013-04-16 DIAGNOSIS — R0902 Hypoxemia: Secondary | ICD-10-CM

## 2013-04-16 DIAGNOSIS — J9819 Other pulmonary collapse: Secondary | ICD-10-CM | POA: Diagnosis not present

## 2013-04-16 DIAGNOSIS — Z9181 History of falling: Secondary | ICD-10-CM

## 2013-04-16 DIAGNOSIS — D696 Thrombocytopenia, unspecified: Secondary | ICD-10-CM

## 2013-04-16 DIAGNOSIS — IMO0001 Reserved for inherently not codable concepts without codable children: Secondary | ICD-10-CM

## 2013-04-16 LAB — URINALYSIS, ROUTINE W REFLEX MICROSCOPIC
Ketones, ur: NEGATIVE mg/dL
Leukocytes, UA: NEGATIVE
Nitrite: NEGATIVE
Urobilinogen, UA: 0.2 mg/dL (ref 0.0–1.0)
pH: 5 (ref 5.0–8.0)

## 2013-04-16 LAB — COMPREHENSIVE METABOLIC PANEL
Albumin: 3 g/dL — ABNORMAL LOW (ref 3.5–5.2)
Alkaline Phosphatase: 88 U/L (ref 39–117)
BUN: 26 mg/dL — ABNORMAL HIGH (ref 6–23)
Chloride: 102 mEq/L (ref 96–112)
Creatinine, Ser: 0.85 mg/dL (ref 0.50–1.10)
GFR calc Af Amer: 68 mL/min — ABNORMAL LOW (ref >90)
Glucose, Bld: 143 mg/dL — ABNORMAL HIGH (ref 70–99)
Potassium: 4 mEq/L (ref 3.5–5.1)
Total Bilirubin: 0.5 mg/dL (ref 0.3–1.2)
Total Protein: 6.8 g/dL (ref 6.0–8.3)

## 2013-04-16 LAB — CBC
HCT: 36 % (ref 36.0–46.0)
Hemoglobin: 12 g/dL (ref 12.0–15.0)
MCH: 29.9 pg (ref 26.0–34.0)
MCHC: 33.3 g/dL (ref 30.0–36.0)
MCV: 89.6 fL (ref 78.0–100.0)
Platelets: 176 10*3/uL (ref 150–400)
RBC: 4.02 MIL/uL (ref 3.87–5.11)
RDW: 14.4 % (ref 11.5–15.5)
WBC: 10.3 10*3/uL (ref 4.0–10.5)

## 2013-04-16 LAB — TYPE AND SCREEN
ABO/RH(D): O POS
Antibody Screen: NEGATIVE

## 2013-04-16 LAB — URINE MICROSCOPIC-ADD ON

## 2013-04-16 LAB — ABO/RH: ABO/RH(D): O POS

## 2013-04-16 NOTE — ED Notes (Signed)
Pt coming from [facility] nursing home, confirmed hip fracture. Pt sees [facility]

## 2013-04-16 NOTE — H&P (Signed)
[facility] History and Physical  Peggy Pratt:[id number] DOB: January 30, 1921    PCP:   Ailene Ravel, MD   Chief Complaint: found to have left displaced hip Fx after an unwitnessed fall.  HPI: Peggy Pratt is an 79 y.o. female with hx of advanced dementia, osteoporosis, recents falls, SNF resident with DNR code statuts, recent wrist and shouldder Fx, fell unwitnessed at the SNF last Tuesday, brought to the ER as hip Xray finally showed displaced Fx of the left hip.  She had 2 prior xrays which didn't show any Fx, but this last one did.  She was given Fentanyl via EMS, and now is lethargic and unable converse.  Her daughter in law (a [facility]) gave me most of her history.  X ray repeated here showed slightly displaced left humeral neck Fx.  Her head CT was negative, and serology was unremarkable.  Her CXR showed mild congestion.  Orthopedics were consulted and will see her in the am, with plan to do surgery early tomorrow morning.  Hospitalist was asked to admit her for hip Fx's pathway.  Rewiew of Systems: Unable to obtain meaningful ROS due to her lethargy.  Past Medical History  Diagnosis Date  . Alzheimer's dementia   . Recurrent falls   . Compression fracture     Past Surgical History  Procedure Laterality Date  . Wrist surgery Right   . Cataract extraction, bilateral      Medications:  HOME MEDS: Prior to Admission medications   Medication Sig Start Date End Date Taking? Authorizing Provider  acetaminophen (TYLENOL) 325 MG tablet Take 325 mg by mouth every 4 (four) hours as needed for pain.   Yes Historical Provider, MD  ALPRAZolam (XANAX) 0.25 MG tablet Take 0.25 mg by mouth 3 (three) times daily.   Yes Historical Provider, MD  cholecalciferol (VITAMIN D) 1000 UNITS tablet Take 1,000 Units by mouth daily.   Yes Historical Provider, MD  citalopram (CELEXA) 20 MG tablet Take 20 mg by mouth daily.    Yes Historical Provider, MD  Memantine HCl ER (NAMENDA XR) 28  MG CP24 Take 1 capsule by mouth daily.   Yes Historical Provider, MD  Multiple Vitamins-Minerals (MULTIVITAMIN WITH MINERALS) tablet Take 1 tablet by mouth daily.   Yes Historical Provider, MD  traMADol (ULTRAM) 50 MG tablet Take 50 mg by mouth every 4 (four) hours as needed for pain.   Yes Historical Provider, MD     Allergies:  Allergies  Allergen Reactions  . Penicillins     Unknown reaction    Social History:   reports that she has never smoked. She has never used smokeless tobacco. She reports that she does not drink alcohol or use illicit drugs.  Family History: Family History  Problem Relation Age of Onset  . Alzheimer's disease Neg Hx   . Heart disease Neg Hx      Physical Exam: Filed Vitals:   04/16/13 1847  BP: 112/66  Pulse: 80  Temp: 99.1 F (37.3 C)  TempSrc: Rectal  Resp: 16  SpO2: 94%   Blood pressure 112/66, pulse 80, temperature 99.1 F (37.3 C), temperature source Rectal, resp. rate 16, SpO2 94.00%.  GEN:  Pleasant  patient lying in the stretcher in no acute distress; lethargic. PSYCH:  ; does not appear anxious or depressed; affect is appropriate. HEENT: Mucous membranes pink and anicteric; PERRLA; EOM intact; no cervical lymphadenopathy nor thyromegaly or carotid bruit; no JVD; There were no stridor. Neck is very  supple. Breasts:: Not examined CHEST WALL: No tenderness CHEST: Normal respiration, clear to auscultation bilaterally.  HEART: Regular rate and rhythm.  There is a 3/6 SEM at the LSB. With no rub, or gallops.   BACK: No kyphosis or scoliosis; no CVA tenderness ABDOMEN: soft and non-tender; no masses, no organomegaly, normal abdominal bowel sounds; no pannus; no intertriginous candida. There is no rebound and no distention. Rectal Exam: Not done EXTREMITIES:No edema, left leg shorter and externally rotated. Genitalia: not examined PULSES: 2+ and symmetric SKIN: Normal hydration no rash or ulceration CNS: facial symmetry with grimaces,  pupil small and slightly reactive.  Lethargic and doesn't follow command.  Labs on Admission:  Basic Metabolic Panel:  Recent Labs Lab 04/16/13 2007  NA 138  K 4.0  CL 102  CO2 28  GLUCOSE 143*  BUN 26*  CREATININE 0.85  CALCIUM 8.9   Liver Function Tests:  Recent Labs Lab 04/16/13 2007  AST 26  ALT 16  ALKPHOS 88  BILITOT 0.5  PROT 6.8  ALBUMIN 3.0*   No results found for this basename: LIPASE, AMYLASE,  in the last 168 hours No results found for this basename: AMMONIA,  in the last 168 hours CBC:  Recent Labs Lab 04/16/13 2007  WBC 10.3  HGB 12.0  HCT 36.0  MCV 89.6  PLT 176   Cardiac Enzymes: No results found for this basename: CKTOTAL, CKMB, CKMBINDEX, TROPONINI,  in the last 168 hours  CBG: No results found for this basename: GLUCAP,  in the last 168 hours   Radiological Exams on Admission: Dg Chest 1 View  04/16/2013   CLINICAL DATA:  Fall with right-sided rib pain.  EXAM: CHEST - 1 VIEW  COMPARISON:  02/02/2013  FINDINGS: The heart is enlarged. There is moderate vascular congestion without overt CHF. The bones are demineralized. Chronic left humeral neck fracture. An acute rib fracture on the right is not visible. There is no pneumothorax or effusion.  IMPRESSION: Stable cardiomegaly and osseous findings. No acute rib fracture. Moderate vascular congestion.   Electronically Signed   By: Davonna Belling M.D.   On: 04/16/2013 20:13   Dg Hip Complete Left  04/16/2013   CLINICAL DATA:  Fall. Left hip injury and pain.  EXAM: LEFT HIP - COMPLETE 2+ VIEW  COMPARISON:  None.  FINDINGS: A mildly displaced left femoral neck fracture is seen. No evidence of dislocation. No pelvic fracture identified. Large calcified uterine fibroid noted in the central pelvis.  IMPRESSION: Mildly displaced left femoral neck fracture. Osteopenia.   Electronically Signed   By: Myles Rosenthal M.D.   On: 04/16/2013 20:09   Ct Head Wo Contrast  04/16/2013   CLINICAL DATA:  Decreased level  of consciousness. Alzheimer's disease.  EXAM: CT HEAD WITHOUT CONTRAST  TECHNIQUE: Contiguous axial images were obtained from the base of the skull through the vertex without contrast.  COMPARISON:  MR brain 04/03/2010.  FINDINGS: No evidence for acute infarction, hemorrhage, mass lesion, hydrocephalus, or extra-axial fluid. Moderate atrophy. Chronic microvascular ischemic change. Vascular calcification. Calvarium intact. Clear sinuses and mastoids.  IMPRESSION: No acute intracranial abnormality. Atrophy and small vessel disease. Similar appearance to priors.   Electronically Signed   By: Davonna Belling M.D.   On: 04/16/2013 20:25    Assessment/Plan Present on Admission:  . Fracture of left hip requiring operative repair . Alzheimer's dementia . Thrombocytopenia, unspecified . Heart murmur . DNR (do not resuscitate)  PLAN:  Will admit her for left hip fracture.  Accepting an increased risk for peri-operative (moderate) cardiovascular complications, she is clear for surgery as it is still the best tx for her.  Please be judicial with IVF and IV narcotics.  She is a DNR and I have confirmed with her daughter in law [facility]onda [facility]), and her son who are at her bedside. For her mild thrombocytopenia, will follow.  She is stable and will be made NPO anticipate surgery tomorrow am.  She will be admitted to [facility] service.  Thank you for asking me to participate in her care.  Other plans as per orders.  Code Status: DNR.   Houston Siren, MD. [facility] Pager [phone] 7pm to 7am.  04/16/2013, 9:27 PM

## 2013-04-16 NOTE — Progress Notes (Signed)
CSW met with pts daughter-in-law outside of pts room as patient was being prepared to be moved.  Daughter in law, Syndi Pua [phone]) confirms that pt is a resident of [facility] and that the family plans for her to return once she is medically stable.  Daughter reports that pts son, Coley Littles had just left but that he is pts HCPOA and POA.  Daughter-in-law reports that pt is DNR.  Daughter-in-law thanked CSW for concern.  Marva Panda, Theresia Majors  [phone] .04/16/2013  9:50 pm

## 2013-04-16 NOTE — ED Notes (Signed)
MD at bedside. 

## 2013-04-16 NOTE — ED Notes (Signed)
Bed: RU04 Expected date:  Expected time:  Means of arrival:  Comments: [place]

## 2013-04-16 NOTE — ED Notes (Signed)
[place] Daughter in [place] cell [phone], home [phone]

## 2013-04-16 NOTE — ED Notes (Signed)
Bed: WA11 Expected date:  Expected time:  Means of arrival:  Comments: [place] 

## 2013-04-16 NOTE — ED Notes (Addendum)
Per EMS patient reports to ED for confirmed left hip fracture today and decreased LOC since Tuesday. Staff at [facility] attributes fracture to fall on Tuesday, after which patient's xray showed no fracture. Staff denies any new falls, although patient's xray now shows new hip fracture today. Patient is responsive only to pain, moans when moved. EMS administered 50 mcg of Fentanyl, after which patient's LOC decreased further.  Family at bedside states patient has been reporting side pain to visitors this past week.

## 2013-04-16 NOTE — ED Provider Notes (Signed)
CSN: [id number]     Arrival date & time 04/16/13  1827 History   First MD Initiated Contact with Patient 04/16/13 1806     Chief Complaint  Patient presents with  . Hip Injury   (Consider location/radiation/quality/duration/timing/severity/associated sxs/prior Treatment) HPI Patient is a 79 yo female with history of alzheimers dementia and recurrent falls who presents from [facility] with reported left hip fracture. Per report from nursing and patients daughter the patient fell on Tuesday. She was found over the side of her bed. They are unable to tell me what part of her body she fell on. At that time they did an X-ray of her hips that was negative. They repeated this on Thursday and was still negative. Today they repeated the X-ray of her pelvis and this revealed per daughters report left femoral neck fracture. They sent her to the ED for further evaluation. Of note, the daughter and nursing report that the patients level of consciousness has progressively declined since her fall on Tuesday and became worsened following administration of fentanyl for pain en route to the ED. Daughter notes she has not had any additional complaints.  Past Medical History  Diagnosis Date  . Alzheimer's dementia   . Recurrent falls   . Compression fracture    Past Surgical History  Procedure Laterality Date  . Wrist surgery Right   . Cataract extraction, bilateral     Family History  Problem Relation Age of Onset  . Alzheimer's disease Neg Hx   . Heart disease Neg Hx    History  Substance Use Topics  . Smoking status: Never Smoker   . Smokeless tobacco: Never Used  . Alcohol Use: No   OB History   Grav Para Term Preterm Abortions TAB SAB Ect Mult Living                 Review of Systems  Constitutional: Positive for activity change (decreased level of activity). Negative for fever.  Respiratory: Negative for chest tightness and shortness of breath.   Cardiovascular: Negative for chest  pain and leg swelling.  Gastrointestinal: Negative for abdominal pain.  Musculoskeletal:       Left hip pain  Neurological: Negative for weakness and headaches.  Psychiatric/Behavioral:       Decreased level of arousal    Allergies  Penicillins  Home Medications   Current Outpatient Rx  Name  Route  Sig  Dispense  Refill  . acetaminophen (TYLENOL) 325 MG tablet   Oral   Take 325 mg by mouth every 4 (four) hours as needed for pain.         Marland Kitchen ALPRAZolam (XANAX) 0.25 MG tablet   Oral   Take 0.25 mg by mouth 3 (three) times daily.         . cholecalciferol (VITAMIN D) 1000 UNITS tablet   Oral   Take 1,000 Units by mouth daily.         . citalopram (CELEXA) 20 MG tablet   Oral   Take 20 mg by mouth daily.          . Memantine HCl ER (NAMENDA XR) 28 MG CP24   Oral   Take 1 capsule by mouth daily.         . Multiple Vitamins-Minerals (MULTIVITAMIN WITH MINERALS) tablet   Oral   Take 1 tablet by mouth daily.         . traMADol (ULTRAM) 50 MG tablet   Oral   Take 50  mg by mouth every 4 (four) hours as needed for pain.          BP 112/66  Pulse 80  Temp(Src) 99.1 F (37.3 C) (Rectal)  Resp 16  SpO2 94% Physical Exam  Constitutional: She appears well-developed and well-nourished.  HENT:  Head: Normocephalic and atraumatic.  No battle sign, right TM obscured by cerumen, left TM with no hemotympanum  Eyes: Pupils are equal, round, and reactive to light.  Pupils small  Neck: Neck supple.  Cardiovascular: Normal rate, regular rhythm and normal heart sounds.   Pulmonary/Chest: Effort normal and breath sounds normal.  Anterior and lateral breath sounds  Abdominal: Soft. She exhibits no distension.  No apparent tenderness  Musculoskeletal: She exhibits tenderness (over left hip and left ribs). She exhibits no edema.  Hip girdle is wrapped in sheet, left LE is externally rotated  Neurological:  Patient is responsive to pain with moaning, will not follow  commands  Skin: Skin is warm and dry.    ED Course  Procedures (including critical care time) Labs Review Labs Reviewed  COMPREHENSIVE METABOLIC PANEL - Abnormal; Notable for the following:    Glucose, Bld 143 (*)    BUN 26 (*)    Albumin 3.0 (*)    GFR calc non Af Amer 59 (*)    GFR calc Af Amer 68 (*)    All other components within normal limits  CBC  URINALYSIS, ROUTINE W REFLEX MICROSCOPIC  TYPE AND SCREEN  ABO/RH   Imaging Review Dg Chest 1 View  04/16/2013   CLINICAL DATA:  Fall with right-sided rib pain.  EXAM: CHEST - 1 VIEW  COMPARISON:  02/02/2013  FINDINGS: The heart is enlarged. There is moderate vascular congestion without overt CHF. The bones are demineralized. Chronic left humeral neck fracture. An acute rib fracture on the right is not visible. There is no pneumothorax or effusion.  IMPRESSION: Stable cardiomegaly and osseous findings. No acute rib fracture. Moderate vascular congestion.   Electronically Signed   By: Davonna Belling M.D.   On: 04/16/2013 20:13   Dg Hip Complete Left  04/16/2013   CLINICAL DATA:  Fall. Left hip injury and pain.  EXAM: LEFT HIP - COMPLETE 2+ VIEW  COMPARISON:  None.  FINDINGS: A mildly displaced left femoral neck fracture is seen. No evidence of dislocation. No pelvic fracture identified. Large calcified uterine fibroid noted in the central pelvis.  IMPRESSION: Mildly displaced left femoral neck fracture. Osteopenia.   Electronically Signed   By: Myles Rosenthal M.D.   On: 04/16/2013 20:09   Ct Head Wo Contrast  04/16/2013   CLINICAL DATA:  Decreased level of consciousness. Alzheimer's disease.  EXAM: CT HEAD WITHOUT CONTRAST  TECHNIQUE: Contiguous axial images were obtained from the base of the skull through the vertex without contrast.  COMPARISON:  MR brain 04/03/2010.  FINDINGS: No evidence for acute infarction, hemorrhage, mass lesion, hydrocephalus, or extra-axial fluid. Moderate atrophy. Chronic microvascular ischemic change. Vascular  calcification. Calvarium intact. Clear sinuses and mastoids.  IMPRESSION: No acute intracranial abnormality. Atrophy and small vessel disease. Similar appearance to priors.   Electronically Signed   By: Davonna Belling M.D.   On: 04/16/2013 20:25    EKG Interpretation   None       MDM   1. Left displaced femoral neck fracture, closed, initial encounter   2. Decreased level of consciousness   3. Alzheimer's dementia    7:00 pm: Patient seen and examined. Comes from [facility].  Presented with reported left hip fracture though no imaging or imaging report accompanied the patient. Noted to have fallen on Tuesday and XR at that time showed no hip fracture. No falls since then reported, though today they obtained repeat imaging that is reported to have a left femoral neck fracture. Additionally the patient is noted to have declining level of consciousness since her fall on Tuesday that worsened in the ambulance following receiving fentanyl. Will order repeat imaging of left hip. CXR to evaluate ribs and for pulmonary disease given increased O2 requirement and decreased level of consciousness. Also CT head with decreased level of consciousness. Additionally will obtain CBC, CMET, EKG, and type and screen.  8:40 pm: patient with mildly displaced left femoral neck fracture. CT head negative for acute abnormality. CXR negative for rib fracture. Likely cause of decreased level of consciousness is related to fentanyl today en route to hospital. Will place consult for [facility] to admit the patient and place call for ortho consult.   8:58 pm: I have spoken to Dr Thomasena Edis of [facility]. They will consult on the patient and are aware of the patient. Will see the patient in the morning following admission by hospitalists.  9:05 pm: I have spoken to [facility] on call and they will come to evaluate the patient for admission. Will order UA at their request.  This patient was seen and  discussed with my attending Dr Denton Lank.  Marikay Alar, MD [facility] PGY-2 04/16/13 10:27 pm  Glori Luis, MD 04/16/13 2236

## 2013-04-16 NOTE — ED Provider Notes (Signed)
I saw and evaluated the patient, reviewed the resident's note and I agree with the findings and plan.  Date: 04/16/2013  Rate: 81  Rhythm: normal sinus rhythm  QRS Axis: left  Intervals: normal  ST/T Wave abnormalities: normal  Conduction Disutrbances:none  Narrative Interpretation:   Old EKG Reviewed: none available  Pt s/p recent fall, c/o left hip pain. Distal pulses palp. Spine nt.   Xr.  Labs. Admit.     Suzi Roots, MD 04/16/13 2021

## 2013-04-17 ENCOUNTER — Inpatient Hospital Stay (HOSPITAL_COMMUNITY): Payer: Medicare Other | Admitting: Anesthesiology

## 2013-04-17 ENCOUNTER — Inpatient Hospital Stay (HOSPITAL_COMMUNITY): Payer: Medicare Other

## 2013-04-17 ENCOUNTER — Encounter (HOSPITAL_COMMUNITY): Payer: Medicare Other | Admitting: Anesthesiology

## 2013-04-17 ENCOUNTER — Encounter (HOSPITAL_COMMUNITY)
Admission: EM | Disposition: A | Discharge status: Skilled Nursing Facility | Payer: Self-pay | Source: Home / Self Care | Attending: Internal Medicine

## 2013-04-17 DIAGNOSIS — Z9181 History of falling: Secondary | ICD-10-CM

## 2013-04-17 HISTORY — PX: HIP ARTHROPLASTY: SHX981

## 2013-04-17 LAB — CBC
Hemoglobin: 11.4 g/dL — ABNORMAL LOW (ref 12.0–15.0)
MCH: 29.3 pg (ref 26.0–34.0)
MCV: 89.7 fL (ref 78.0–100.0)
Platelets: 169 10*3/uL (ref 150–400)
RBC: 3.89 MIL/uL (ref 3.87–5.11)
WBC: 8.5 10*3/uL (ref 4.0–10.5)

## 2013-04-17 LAB — CREATININE, SERUM
Creatinine, Ser: 0.67 mg/dL (ref 0.50–1.10)
GFR calc Af Amer: 87 mL/min — ABNORMAL LOW (ref >90)
GFR calc non Af Amer: 75 mL/min — ABNORMAL LOW (ref >90)

## 2013-04-17 LAB — SURGICAL PCR SCREEN
MRSA, PCR: NEGATIVE
Staphylococcus aureus: NEGATIVE

## 2013-04-17 SURGERY — HEMIARTHROPLASTY, HIP, DIRECT ANTERIOR APPROACH, FOR FRACTURE
Anesthesia: General | Site: Hip | Laterality: Left | Wound class: Clean

## 2013-04-17 MED ORDER — ONDANSETRON HCL 4 MG PO TABS: 4.0000 mg | ORAL_TABLET | Freq: Four times a day (QID) | ORAL | Status: DC | PRN

## 2013-04-17 MED ORDER — CLINDAMYCIN PHOSPHATE 900 MG/50ML IV SOLN
INTRAVENOUS | Status: DC | PRN
  Administered 2013-04-17: 900 mg via INTRAVENOUS

## 2013-04-17 MED ORDER — ASPIRIN EC 325 MG PO TBEC: 325.0000 mg | DELAYED_RELEASE_TABLET | Freq: Every day | ORAL | Status: DC

## 2013-04-17 MED ORDER — ASPIRIN EC 325 MG PO TBEC
325.0000 mg | DELAYED_RELEASE_TABLET | Freq: Every day | ORAL | Status: DC
  Administered 2013-04-18: 10:00:00 325 mg via ORAL
  Filled 2013-04-17 (×2): qty 1

## 2013-04-17 MED ORDER — DOCUSATE SODIUM 100 MG PO CAPS
100.0000 mg | ORAL_CAPSULE | Freq: Two times a day (BID) | ORAL | Status: DC
  Administered 2013-04-17 – 2013-04-23 (×10): 100 mg via ORAL

## 2013-04-17 MED ORDER — ENSURE COMPLETE PO LIQD
237.0000 mL | Freq: Two times a day (BID) | ORAL | Status: DC
  Administered 2013-04-18 – 2013-04-21 (×3): 237 mL via ORAL

## 2013-04-17 MED ORDER — LACTATED RINGERS IV SOLN
INTRAVENOUS | Status: DC | PRN
  Administered 2013-04-17 (×2): via INTRAVENOUS

## 2013-04-17 MED ORDER — ADULT MULTIVITAMIN W/MINERALS CH
1.0000 | ORAL_TABLET | Freq: Every day | ORAL | Status: DC
  Administered 2013-04-18 – 2013-04-22 (×4): 1 via ORAL
  Filled 2013-04-17 (×6): qty 1

## 2013-04-17 MED ORDER — LIDOCAINE HCL (CARDIAC) 20 MG/ML IV SOLN
INTRAVENOUS | Status: DC | PRN
  Administered 2013-04-17: 50 mg via INTRAVENOUS

## 2013-04-17 MED ORDER — ONDANSETRON HCL 4 MG/2ML IJ SOLN: 4.0000 mg | Freq: Four times a day (QID) | INTRAMUSCULAR | Status: DC | PRN

## 2013-04-17 MED ORDER — ACETAMINOPHEN 650 MG RE SUPP: 650.0000 mg | Freq: Four times a day (QID) | RECTAL | Status: DC | PRN

## 2013-04-17 MED ORDER — METOCLOPRAMIDE HCL 5 MG/ML IJ SOLN: 5.0000 mg | Freq: Three times a day (TID) | INTRAMUSCULAR | Status: DC | PRN

## 2013-04-17 MED ORDER — CITALOPRAM HYDROBROMIDE 20 MG PO TABS
20.0000 mg | ORAL_TABLET | Freq: Every day | ORAL | Status: DC
  Administered 2013-04-17 – 2013-04-23 (×6): 20 mg via ORAL
  Filled 2013-04-17 (×7): qty 1

## 2013-04-17 MED ORDER — ACETAMINOPHEN 325 MG PO TABS: 325.0000 mg | ORAL_TABLET | ORAL | Status: DC | PRN

## 2013-04-17 MED ORDER — MORPHINE SULFATE 2 MG/ML IJ SOLN: 0.5000 mg | INTRAMUSCULAR | Status: DC | PRN

## 2013-04-17 MED ORDER — FENTANYL CITRATE 0.05 MG/ML IJ SOLN
INTRAMUSCULAR | Status: DC | PRN
  Administered 2013-04-17 (×2): 50 ug via INTRAVENOUS

## 2013-04-17 MED ORDER — ONDANSETRON HCL 4 MG/2ML IJ SOLN
INTRAMUSCULAR | Status: DC | PRN
  Administered 2013-04-17: 4 mg via INTRAMUSCULAR

## 2013-04-17 MED ORDER — PHENYLEPHRINE HCL 10 MG/ML IJ SOLN
INTRAMUSCULAR | Status: DC | PRN
  Administered 2013-04-17 (×2): 40 ug via INTRAVENOUS

## 2013-04-17 MED ORDER — HYDROCODONE-ACETAMINOPHEN 5-325 MG PO TABS: 1.0000 | ORAL_TABLET | Freq: Four times a day (QID) | ORAL | Status: DC | PRN

## 2013-04-17 MED ORDER — PHENOL 1.4 % MT LIQD
1.0000 | OROMUCOSAL | Status: DC | PRN
  Filled 2013-04-17: qty 177

## 2013-04-17 MED ORDER — SUCCINYLCHOLINE CHLORIDE 20 MG/ML IJ SOLN
INTRAMUSCULAR | Status: DC | PRN
  Administered 2013-04-17: 100 mg via INTRAVENOUS

## 2013-04-17 MED ORDER — VITAMIN D3 25 MCG (1000 UNIT) PO TABS
1000.0000 [IU] | ORAL_TABLET | Freq: Every day | ORAL | Status: DC
  Administered 2013-04-17 – 2013-04-23 (×6): 1000 [IU] via ORAL
  Filled 2013-04-17 (×7): qty 1

## 2013-04-17 MED ORDER — CLINDAMYCIN PHOSPHATE 600 MG/50ML IV SOLN
600.0000 mg | Freq: Four times a day (QID) | INTRAVENOUS | Status: AC
  Administered 2013-04-17 – 2013-04-18 (×2): 600 mg via INTRAVENOUS
  Filled 2013-04-17 (×2): qty 50

## 2013-04-17 MED ORDER — HYDROMORPHONE HCL PF 1 MG/ML IJ SOLN: 0.2500 mg | INTRAMUSCULAR | Status: DC | PRN

## 2013-04-17 MED ORDER — LACTATED RINGERS IV SOLN: INTRAVENOUS | Status: DC

## 2013-04-17 MED ORDER — MENTHOL 3 MG MT LOZG
1.0000 | LOZENGE | OROMUCOSAL | Status: DC | PRN
  Filled 2013-04-17: qty 9

## 2013-04-17 MED ORDER — MEMANTINE HCL 10 MG PO TABS
10.0000 mg | ORAL_TABLET | Freq: Two times a day (BID) | ORAL | Status: DC
  Administered 2013-04-17 – 2013-04-23 (×12): 10 mg via ORAL
  Filled 2013-04-17 (×15): qty 1

## 2013-04-17 MED ORDER — CLINDAMYCIN PHOSPHATE 900 MG/50ML IV SOLN
INTRAVENOUS | Status: AC
  Filled 2013-04-17: qty 50

## 2013-04-17 MED ORDER — HEPARIN SODIUM (PORCINE) 5000 UNIT/ML IJ SOLN
5000.0000 [IU] | Freq: Three times a day (TID) | INTRAMUSCULAR | Status: DC
  Administered 2013-04-17: 5000 [IU] via SUBCUTANEOUS
  Filled 2013-04-17 (×4): qty 1

## 2013-04-17 MED ORDER — 0.9 % SODIUM CHLORIDE (POUR BTL) OPTIME
TOPICAL | Status: DC | PRN
  Administered 2013-04-17: 1000 mL

## 2013-04-17 MED ORDER — HYDROMORPHONE HCL PF 1 MG/ML IJ SOLN: 0.5000 mg | INTRAMUSCULAR | Status: DC | PRN

## 2013-04-17 MED ORDER — PROPOFOL 10 MG/ML IV BOLUS
INTRAVENOUS | Status: DC | PRN
  Administered 2013-04-17: 160 mg via INTRAVENOUS

## 2013-04-17 MED ORDER — POLYETHYLENE GLYCOL 3350 17 G PO PACK
17.0000 g | PACK | Freq: Every day | ORAL | Status: DC | PRN
  Administered 2013-04-21: 20:00:00 17 g via ORAL

## 2013-04-17 MED ORDER — METOCLOPRAMIDE HCL 10 MG PO TABS: 5.0000 mg | ORAL_TABLET | Freq: Three times a day (TID) | ORAL | Status: DC | PRN

## 2013-04-17 MED ORDER — ACETAMINOPHEN 325 MG PO TABS
650.0000 mg | ORAL_TABLET | Freq: Four times a day (QID) | ORAL | Status: DC | PRN
  Administered 2013-04-19: 650 mg via ORAL
  Administered 2013-04-21: 12:00:00 325 mg via ORAL
  Administered 2013-04-22: 17:00:00 650 mg via ORAL
  Filled 2013-04-17 (×3): qty 2

## 2013-04-17 MED ORDER — DEXTROSE-NACL 5-0.9 % IV SOLN
INTRAVENOUS | Status: DC
  Administered 2013-04-17 – 2013-04-19 (×4): via INTRAVENOUS

## 2013-04-17 MED ORDER — ALPRAZOLAM 0.25 MG PO TABS
0.2500 mg | ORAL_TABLET | Freq: Two times a day (BID) | ORAL | Status: DC | PRN
  Administered 2013-04-17 – 2013-04-21 (×3): 0.25 mg via ORAL
  Filled 2013-04-17 (×3): qty 1

## 2013-04-17 SURGICAL SUPPLY — 55 items
ADH SKN CLS APL DERMABOND .7 (GAUZE/BANDAGES/DRESSINGS) ×1
BAG SPEC THK2 15X12 ZIP CLS (MISCELLANEOUS)
BAG ZIPLOCK 12X15 (MISCELLANEOUS) ×1 IMPLANT
BLADE SAW SGTL 18X1.27X75 (BLADE) ×2 IMPLANT
CAPT HIP FX BIPOLAR/UNIPOLAR ×1 IMPLANT
CLOTH BEACON ORANGE TIMEOUT ST (SAFETY) ×2 IMPLANT
DERMABOND ADVANCED (GAUZE/BANDAGES/DRESSINGS) ×1
DERMABOND ADVANCED .7 DNX12 (GAUZE/BANDAGES/DRESSINGS) ×1 IMPLANT
DRAPE INCISE IOBAN 85X60 (DRAPES) ×2 IMPLANT
DRAPE ORTHO SPLIT 77X108 STRL (DRAPES) ×4
DRAPE POUCH INSTRU U-SHP 10X18 (DRAPES) ×2 IMPLANT
DRAPE SURG 17X11 SM STRL (DRAPES) ×2 IMPLANT
DRAPE SURG ORHT 6 SPLT 77X108 (DRAPES) ×2 IMPLANT
DRAPE U-SHAPE 47X51 STRL (DRAPES) ×2 IMPLANT
DRSG AQUACEL AG ADV 3.5X10 (GAUZE/BANDAGES/DRESSINGS) ×2 IMPLANT
DRSG TEGADERM 4X4.75 (GAUZE/BANDAGES/DRESSINGS) ×1 IMPLANT
DURAPREP 26ML APPLICATOR (WOUND CARE) ×2 IMPLANT
ELECT BLADE TIP CTD 4 INCH (ELECTRODE) ×2 IMPLANT
ELECT REM PT RETURN 9FT ADLT (ELECTROSURGICAL) ×2
ELECTRODE REM PT RTRN 9FT ADLT (ELECTROSURGICAL) ×1 IMPLANT
EVACUATOR 1/8 PVC DRAIN (DRAIN) ×1 IMPLANT
FACESHIELD LNG OPTICON STERILE (SAFETY) ×7 IMPLANT
GAUZE SPONGE 2X2 8PLY STRL LF (GAUZE/BANDAGES/DRESSINGS) ×1 IMPLANT
GLOVE BIOGEL PI IND STRL 7.0 (GLOVE) IMPLANT
GLOVE BIOGEL PI IND STRL 7.5 (GLOVE) ×1 IMPLANT
GLOVE BIOGEL PI IND STRL 8 (GLOVE) ×1 IMPLANT
GLOVE BIOGEL PI INDICATOR 7.0 (GLOVE) ×1
GLOVE BIOGEL PI INDICATOR 7.5 (GLOVE) ×1
GLOVE BIOGEL PI INDICATOR 8 (GLOVE) ×1
GLOVE ECLIPSE 8.0 STRL XLNG CF (GLOVE) ×1 IMPLANT
GLOVE ORTHO TXT STRL SZ7.5 (GLOVE) ×4 IMPLANT
GLOVE SURG ORTHO 8.0 STRL STRW (GLOVE) ×1 IMPLANT
GLOVE SURG SS PI 6.5 STRL IVOR (GLOVE) ×1 IMPLANT
GOWN BRE IMP PREV XXLGXLNG (GOWN DISPOSABLE) ×3 IMPLANT
GOWN PREVENTION PLUS LG XLONG (DISPOSABLE) ×3 IMPLANT
HANDPIECE INTERPULSE COAX TIP (DISPOSABLE)
IMMOBILIZER KNEE 20 (SOFTGOODS)
IMMOBILIZER KNEE 20 THIGH 36 (SOFTGOODS) IMPLANT
KIT BASIN OR (CUSTOM PROCEDURE TRAY) ×2 IMPLANT
MANIFOLD NEPTUNE II (INSTRUMENTS) ×2 IMPLANT
NS IRRIG 1000ML POUR BTL (IV SOLUTION) ×1 IMPLANT
PACK TOTAL JOINT (CUSTOM PROCEDURE TRAY) ×2 IMPLANT
POSITIONER SURGICAL ARM (MISCELLANEOUS) ×2 IMPLANT
SET HNDPC FAN SPRY TIP SCT (DISPOSABLE) IMPLANT
SPONGE GAUZE 2X2 STER 10/PKG (GAUZE/BANDAGES/DRESSINGS)
STRIP CLOSURE SKIN 1/2X4 (GAUZE/BANDAGES/DRESSINGS) ×2 IMPLANT
SUT ETHIBOND NAB CT1 #1 30IN (SUTURE) ×1 IMPLANT
SUT MNCRL AB 4-0 PS2 18 (SUTURE) ×2 IMPLANT
SUT VIC AB 1 CT1 36 (SUTURE) ×4 IMPLANT
SUT VIC AB 2-0 CT1 27 (SUTURE) ×4
SUT VIC AB 2-0 CT1 TAPERPNT 27 (SUTURE) ×2 IMPLANT
SUT VLOC 180 2-0 9IN GS21 (SUTURE) ×1 IMPLANT
TOWEL OR 17X26 10 PK STRL BLUE (TOWEL DISPOSABLE) ×4 IMPLANT
TRAY FOLEY CATH 14FRSI W/METER (CATHETERS) ×1 IMPLANT
WATER STERILE IRR 1500ML POUR (IV SOLUTION) ×2 IMPLANT

## 2013-04-17 NOTE — Brief Op Note (Signed)
04/16/2013 - 04/17/2013  5:01 PM  PATIENT:  Peggy Pratt  79 y.o. female  PRE-OPERATIVE DIAGNOSIS: Displaced left femoral neck fracture  POST-OPERATIVE DIAGNOSIS:  Displaced left femoral neck fracture  PROCEDURE:  Procedure(s): Left hip hemiarthroplasty  - Depuy hip components  SURGEON:  Surgeon(s) and Role:    * Shelda Pal, MD - Primary  PHYSICIAN ASSISTANT: Lanney Gins, PA-C  ANESTHESIA:   general  EBL:  Total I/O In: 1258.3 [I.V.:1158.3; Other:100] Out: 525 [Urine:475; Blood:50]  BLOOD ADMINISTERED:none  DRAINS: none   LOCAL MEDICATIONS USED:  NONE  SPECIMEN:  No Specimen  DISPOSITION OF SPECIMEN:  N/A  COUNTS:  YES  TOURNIQUET:  * No tourniquets in log *  DICTATION: .Other Dictation: Dictation Number [id number]  PLAN OF CARE: Admit to inpatient   PATIENT DISPOSITION:  PACU - hemodynamically stable.   Delay start of Pharmacological VTE agent (>24hrs) due to surgical blood loss or risk of bleeding: no

## 2013-04-17 NOTE — Progress Notes (Signed)
Clinical Social Work Department BRIEF PSYCHOSOCIAL ASSESSMENT 04/17/2013  Patient:  Peggy Pratt, Peggy Pratt     Account Number:  [id number]     Admit date:  04/16/2013  Clinical Social Worker:  Orpah Greek  Date/Time:  04/17/2013 03:59 PM  Referred by:  Physician  Date Referred:  04/17/2013 Referred for  Other - See comment   Other Referral:   Admitted from: [facility]   Interview type:  Family Other interview type:   Daughter in Social worker, Winry Egnew [phone])    PSYCHOSOCIAL DATA Living Status:  FACILITY Admitted from facility:  [facility] Level of care:  Skilled Nursing Facility Primary support name:  Zollie Beckers "Dub" Mcmiller (son) ph#: [phone] Primary support relationship to patient:  CHILD, ADULT Degree of support available:   good    CURRENT CONCERNS Current Concerns  Post-Acute Placement   Other Concerns:    SOCIAL WORK ASSESSMENT / PLAN CSW received consult that patient was admitted from [facility].   Assessment/plan status:  Information/Referral to [facility] Other assessment/ plan:   Information/referral to community resources:   CSW completed FL2 and sent information to [facility], confirmed with Revonda Standard @ [facility] that they would be able to take patient back when ready.    PATIENT'S/FAMILY'S RESPONSE TO PLAN OF CARE: Patient's daughter-in-law, (who is an [facility] [employer]) informed CSW that family is paying privately to hold her bed @ [facility]. Patient is scheduled for surgery today. CSW will follow-up re: discharge.       Unice Bailey, LCSW [facility] Clinical Social Worker cell #: [phone]

## 2013-04-17 NOTE — Anesthesia Preprocedure Evaluation (Addendum)
Anesthesia Evaluation  Patient identified by MRN, date of birth, ID band Patient awake    Reviewed: Allergy & Precautions, H&P , NPO status , Patient's Chart, lab work & pertinent test results  Airway Mallampati: II TM Distance: >3 FB Neck ROM: full    Dental no notable dental hx. (+) Teeth Intact and Dental Advisory Given   Pulmonary neg pulmonary ROS,  breath sounds clear to auscultation  Pulmonary exam normal       Cardiovascular negative cardio ROS  Rhythm:regular Rate:Normal     Neuro/Psych Severe Alzheimer's dementia.  Patient non communicative today.  DNR negative psych ROS   GI/Hepatic negative GI ROS, Neg liver ROS,   Endo/Other  negative endocrine ROS  Renal/GU negative Renal ROS  negative genitourinary   Musculoskeletal   Abdominal   Peds  Hematology negative hematology ROS (+)   Anesthesia Other Findings   Reproductive/Obstetrics negative OB ROS                          Anesthesia Physical Anesthesia Plan  ASA: III  Anesthesia Plan: General   Post-op Pain Management:    Induction: Intravenous  Airway Management Planned: Oral ETT  Additional Equipment:   Intra-op Plan:   Post-operative Plan: Extubation in OR  Informed Consent: I have reviewed the patients History and Physical, chart, labs and discussed the procedure including the risks, benefits and alternatives for the proposed anesthesia with the patient or authorized representative who has indicated his/her understanding and acceptance.   Dental Advisory Given  Plan Discussed with: CRNA and Surgeon  Anesthesia Plan Comments:         Anesthesia Quick Evaluation

## 2013-04-17 NOTE — Progress Notes (Signed)
[facility] PROGRESS NOTE  Peggy Pratt WJX:[id number] DOB: 09/29/1920 DOA: 04/16/2013 PCP: Ailene Ravel, MD  Assessment/Plan:  1. L femur neck fracture -Ortho Dr.Olin aware, plan for OR this evening -Stop Heparin SQ will need DVT proph post op -NPO for OR  2. Advanced Dementia -stable, continue namenda  -xanax PRN  3. Recent L humerus and wrist Fx  4. Frequent falls -likely from progressive dementia  DVT proph: stopped heparin for today  Code Status: DNR Family Communication: none at bedside this am, will attempt to contact family later today Disposition Plan: will likely need SNF   Consultants:  Ortho Dr.Olin  HPI/Subjective: Confused no complaints  Objective: Filed Vitals:   04/17/13 0650  BP: 124/80  Pulse: 83  Temp: 98.3 F (36.8 C)  Resp: 14    Intake/Output Summary (Last 24 hours) at 04/17/13 0837 Last data filed at 04/17/13 0706  Gross per 24 hour  Intake      0 ml  Output    300 ml  Net   -300 ml   There were no vitals filed for this visit.  Exam:   General:  Alert, awake, oriented to self only, confused  Cardiovascular: S1S2/RRR  Respiratory: diminished at bases  Abdomen: soft, NT  Musculoskeletal: L leg shorter and rotated  Data Reviewed: Basic Metabolic Panel:  Recent Labs Lab 04/16/13 2007 04/17/13 0740  NA 138  --   K 4.0  --   CL 102  --   CO2 28  --   GLUCOSE 143*  --   BUN 26*  --   CREATININE 0.85 0.67  CALCIUM 8.9  --    Liver Function Tests:  Recent Labs Lab 04/16/13 2007  AST 26  ALT 16  ALKPHOS 88  BILITOT 0.5  PROT 6.8  ALBUMIN 3.0*   No results found for this basename: LIPASE, AMYLASE,  in the last 168 hours No results found for this basename: AMMONIA,  in the last 168 hours CBC:  Recent Labs Lab 04/16/13 2007 04/17/13 0740  WBC 10.3 8.5  HGB 12.0 11.4*  HCT 36.0 34.9*  MCV 89.6 89.7  PLT 176 169   Cardiac Enzymes: No results found for this basename: CKTOTAL, CKMB,  CKMBINDEX, TROPONINI,  in the last 168 hours BNP (last 3 results) No results found for this basename: PROBNP,  in the last 8760 hours CBG: No results found for this basename: GLUCAP,  in the last 168 hours  No results found for this or any previous visit (from the past 240 hour(s)).   Studies: Dg Chest 1 View  04/16/2013   CLINICAL DATA:  Fall with right-sided rib pain.  EXAM: CHEST - 1 VIEW  COMPARISON:  02/02/2013  FINDINGS: The heart is enlarged. There is moderate vascular congestion without overt CHF. The bones are demineralized. Chronic left humeral neck fracture. An acute rib fracture on the right is not visible. There is no pneumothorax or effusion.  IMPRESSION: Stable cardiomegaly and osseous findings. No acute rib fracture. Moderate vascular congestion.   Electronically Signed   By: Davonna Belling M.D.   On: 04/16/2013 20:13   Dg Hip Complete Left  04/16/2013   CLINICAL DATA:  Fall. Left hip injury and pain.  EXAM: LEFT HIP - COMPLETE 2+ VIEW  COMPARISON:  None.  FINDINGS: A mildly displaced left femoral neck fracture is seen. No evidence of dislocation. No pelvic fracture identified. Large calcified uterine fibroid noted in the central pelvis.  IMPRESSION: Mildly displaced left femoral neck  fracture. Osteopenia.   Electronically Signed   By: Myles Rosenthal M.D.   On: 04/16/2013 20:09   Ct Head Wo Contrast  04/16/2013   CLINICAL DATA:  Decreased level of consciousness. Alzheimer's disease.  EXAM: CT HEAD WITHOUT CONTRAST  TECHNIQUE: Contiguous axial images were obtained from the base of the skull through the vertex without contrast.  COMPARISON:  MR brain 04/03/2010.  FINDINGS: No evidence for acute infarction, hemorrhage, mass lesion, hydrocephalus, or extra-axial fluid. Moderate atrophy. Chronic microvascular ischemic change. Vascular calcification. Calvarium intact. Clear sinuses and mastoids.  IMPRESSION: No acute intracranial abnormality. Atrophy and small vessel disease. Similar  appearance to priors.   Electronically Signed   By: Davonna Belling M.D.   On: 04/16/2013 20:25    Scheduled Meds: . cholecalciferol  1,000 Units Oral Daily  . citalopram  20 mg Oral Daily  . docusate sodium  100 mg Oral BID  . heparin  5,000 Units Subcutaneous Q8H  . memantine  10 mg Oral BID   Continuous Infusions: . dextrose 5 % and 0.9% NaCl 50 mL/hr at 04/17/13 1610    Principal Problem:   Fracture of left hip requiring operative repair Active Problems:   Alzheimer's dementia   Recurrent falls   Heart murmur   Thrombocytopenia, unspecified   DNR (do not resuscitate)    Time spent:    [facility]  [facility] Pager [phone]. If 7PM-7AM, please contact night-coverage at [email or web address], password [facility] 04/17/2013, 8:37 AM  LOS: 1 day

## 2013-04-17 NOTE — Anesthesia Postprocedure Evaluation (Signed)
  Anesthesia Post-op Note  Patient: Peggy Pratt  Procedure(s) Performed: Procedure(s) (LRB): ARTHROPLASTY BIPOLAR HIP (Left)  Patient Location: PACU  Anesthesia Type: General  Level of Consciousness: awake and alert   Airway and Oxygen Therapy: Patient Spontanous Breathing  Post-op Pain: mild  Post-op Assessment: Post-op Vital signs reviewed, Patient's Cardiovascular Status Stable, Respiratory Function Stable, Patent Airway and No signs of Nausea or vomiting  Last Vitals:  Filed Vitals:   04/17/13 1730  BP: 101/57  Pulse: 84  Temp:   Resp: 17    Post-op Vital Signs: stable   Complications: No apparent anesthesia complications

## 2013-04-17 NOTE — Transfer of Care (Signed)
Immediate Anesthesia Transfer of Care Note  Patient: Peggy Pratt  Procedure(s) Performed: Procedure(s): ARTHROPLASTY BIPOLAR HIP (Left)  Patient Location: PACU  Anesthesia Type:General  Level of Consciousness: awake, alert  and oriented  Airway & Oxygen Therapy: Patient Spontanous Breathing and Patient connected to face mask oxygen  Post-op Assessment: Report given to PACU RN and Post -op Vital signs reviewed and stable  Post vital signs: Reviewed and stable  Complications: No apparent anesthesia complications

## 2013-04-17 NOTE — Progress Notes (Signed)
INITIAL NUTRITION ASSESSMENT  DOCUMENTATION CODES Per approved criteria  -Not Applicable   INTERVENTION: Provide Ensure Complete BID Provide Multivitamin with minerals daily  NUTRITION DIAGNOSIS: Predicted sub-optimal energy intake related to dementia, low braden score, and scheduled surgery as evidenced by pt's chart.   Goal: Pt to meet >/= 90% of their estimated nutrition needs   Monitor:  Diet advancement PO intake Weight Labs  Reason for Assessment: Low braden  79 y.o. female  Admitting Dx: Fracture of left hip requiring operative repair  ASSESSMENT: 79 y.o. female with hx of advanced dementia, osteoporosis, recents falls, SNF resident with DNR code statuts, recent wrist and shouldder Fx, fell unwitnessed at the SNF last Tuesday, brought to the ER as hip Xray finally showed displaced Fx of the left hip.   Pt out of room for left hip hemiarthroplasty at time of visit. Pt has been NPO all day. No reports of weight loss in MST report. Pt was on a regular diet PTA. No weight or height history in chart. Per chart pt was taking a multivitamin and vitamin D supplement PTA.   Height: Ht Readings from Last 1 Encounters:  No data found for Ht    Weight: Wt Readings from Last 1 Encounters:  No data found for Wt    Ideal Body Weight: unknown  % Ideal Body Weight: NA  Wt Readings from Last 10 Encounters:  No data found for Wt    Usual Body Weight: unknown  % Usual Body Weight: NA  BMI:  There is no height or weight on file to calculate BMI.  Estimated Nutritional Needs: Kcal: 1300-1500 (based on age) Protein: 50-60 grams Fluid: 1.3-1.5L *Will re-calculate estimated needs once pt is weighed and measured*  Skin: intact  Diet Order: NPO  EDUCATION NEEDS: -No education needs identified at this time   Intake/Output Summary (Last 24 hours) at 04/17/13 1628 Last data filed at 04/17/13 1409  Gross per 24 hour  Intake 258.33 ml  Output    400 ml  Net -141.67  ml    Last BM: PTA  Labs:   Recent Labs Lab 04/16/13 2007 04/17/13 0740  NA 138  --   K 4.0  --   CL 102  --   CO2 28  --   BUN 26*  --   CREATININE 0.85 0.67  CALCIUM 8.9  --   GLUCOSE 143*  --     CBG (last 3)  No results found for this basename: GLUCAP,  in the last 72 hours  Scheduled Meds: . [facility] HOLD] cholecalciferol  1,000 Units Oral Daily  . [facility] HOLD] citalopram  20 mg Oral Daily  . [facility] HOLD] docusate sodium  100 mg Oral BID  . [facility] HOLD] memantine  10 mg Oral BID    Continuous Infusions: . dextrose 5 % and 0.9% NaCl 50 mL/hr at 04/17/13 1610    Past Medical History  Diagnosis Date  . Alzheimer's dementia   . Recurrent falls   . Compression fracture     Past Surgical History  Procedure Laterality Date  . Wrist surgery Right   . Cataract extraction, bilateral      Ian Malkin RD, LDN Inpatient Clinical Dietitian Pager: [phone] After Hours Pager: [phone]

## 2013-04-17 NOTE — Plan of Care (Signed)
Problem: Consults Goal: Hip/Femur Fracture Patient Education See Patient Education Module for education specifics. Outcome: Not Met (add Reason) Dementia. Family member an Charity fundraiser.  Aware of POC. Goal: Nutrition Consult-if indicated Outcome: Not Met (add Reason) Dementia

## 2013-04-17 NOTE — Consult Note (Signed)
Reason for Consult: Left femoral neck fracture Referring Physician:  Emergency Room MDs, and Hospitalist service  Peggy Pratt is an 79 y.o. female.  HPI: Peggy Pratt is an 79 y.o. female with hx of advanced dementia, osteoporosis, recents falls, SNF resident with DNR code statuts, recent wrist and shouldder Fx, fell unwitnessed at the SNF last Tuesday, brought to the ER as hip Xray finally showed displaced Fx of the left hip. She had 2 prior xrays which didn't show any Fx, but this last one did.  I spoke to her daughter in law (a [facility]) gave me most of her history as well as to review planned procedure, risk benefits and necessity   ray repeated here showed slightly displaced left humeral neck Fx. Her head CT was negative, and serology was unremarkable. Her CXR showed mild congestion. Orthopedics was consulted for definitive management of the left hip.   Past Medical History  Diagnosis Date  . Alzheimer's dementia   . Recurrent falls   . Compression fracture     Past Surgical History  Procedure Laterality Date  . Wrist surgery Right   . Cataract extraction, bilateral      Family History  Problem Relation Age of Onset  . Alzheimer's disease Neg Hx   . Heart disease Neg Hx     Social History:  reports that she has never smoked. She has never used smokeless tobacco. She reports that she does not drink alcohol or use illicit drugs.  Allergies:  Allergies  Allergen Reactions  . Penicillins     Unknown reaction    Medications:  I have reviewed the patient's current medications. Scheduled: . cholecalciferol  1,000 Units Oral Daily  . citalopram  20 mg Oral Daily  . docusate sodium  100 mg Oral BID  . memantine  10 mg Oral BID    Results for orders placed during the hospital encounter of 04/16/13 (from the past 24 hour(s))  TYPE AND SCREEN     Status: None   Collection Time    04/16/13  8:07 PM      Result Value Range   ABO/RH(D) O POS     Antibody Screen  NEG     Sample Expiration 04/19/2013    CBC     Status: None   Collection Time    04/16/13  8:07 PM      Result Value Range   WBC 10.3  4.0 - 10.5 K/uL   RBC 4.02  3.87 - 5.11 MIL/uL   Hemoglobin 12.0  12.0 - 15.0 g/dL   HCT 16.1  09.6 - 04.5 %   MCV 89.6  78.0 - 100.0 fL   MCH 29.9  26.0 - 34.0 pg   MCHC 33.3  30.0 - 36.0 g/dL   RDW 40.9  81.1 - 91.4 %   Platelets 176  150 - 400 K/uL  COMPREHENSIVE METABOLIC PANEL     Status: Abnormal   Collection Time    04/16/13  8:07 PM      Result Value Range   Sodium 138  135 - 145 mEq/L   Potassium 4.0  3.5 - 5.1 mEq/L   Chloride 102  96 - 112 mEq/L   CO2 28  19 - 32 mEq/L   Glucose, Bld 143 (*) 70 - 99 mg/dL   BUN 26 (*) 6 - 23 mg/dL   Creatinine, Ser 7.82  0.50 - 1.10 mg/dL   Calcium 8.9  8.4 - 95.6 mg/dL   Total  Protein 6.8  6.0 - 8.3 g/dL   Albumin 3.0 (*) 3.5 - 5.2 g/dL   AST 26  0 - 37 U/L   ALT 16  0 - 35 U/L   Alkaline Phosphatase 88  39 - 117 U/L   Total Bilirubin 0.5  0.3 - 1.2 mg/dL   GFR calc non Af Amer 59 (*) >90 mL/min   GFR calc Af Amer 68 (*) >90 mL/min  ABO/RH     Status: None   Collection Time    04/16/13  8:07 PM      Result Value Range   ABO/RH(D) O POS    URINALYSIS, ROUTINE W REFLEX MICROSCOPIC     Status: Abnormal   Collection Time    04/16/13  9:55 PM      Result Value Range   Color, Urine YELLOW  YELLOW   APPearance CLOUDY (*) CLEAR   Specific Gravity, Urine 1.032 (*) 1.005 - 1.030   pH 5.0  5.0 - 8.0   Glucose, UA NEGATIVE  NEGATIVE mg/dL   Hgb urine dipstick MODERATE (*) NEGATIVE   Bilirubin Urine SMALL (*) NEGATIVE   Ketones, ur NEGATIVE  NEGATIVE mg/dL   Protein, ur NEGATIVE  NEGATIVE mg/dL   Urobilinogen, UA 0.2  0.0 - 1.0 mg/dL   Nitrite NEGATIVE  NEGATIVE   Leukocytes, UA NEGATIVE  NEGATIVE  URINE MICROSCOPIC-ADD ON     Status: Abnormal   Collection Time    04/16/13  9:55 PM      Result Value Range   WBC, UA 0-2  <3 WBC/hpf   RBC / HPF 3-6  <3 RBC/hpf   Bacteria, UA FEW (*) RARE    Urine-Other MUCOUS PRESENT    CBC     Status: Abnormal   Collection Time    04/17/13  7:40 AM      Result Value Range   WBC 8.5  4.0 - 10.5 K/uL   RBC 3.89  3.87 - 5.11 MIL/uL   Hemoglobin 11.4 (*) 12.0 - 15.0 g/dL   HCT 30.8 (*) 65.7 - 84.6 %   MCV 89.7  78.0 - 100.0 fL   MCH 29.3  26.0 - 34.0 pg   MCHC 32.7  30.0 - 36.0 g/dL   RDW 96.2  95.2 - 84.1 %   Platelets 169  150 - 400 K/uL  CREATININE, SERUM     Status: Abnormal   Collection Time    04/17/13  7:40 AM      Result Value Range   Creatinine, Ser 0.67  0.50 - 1.10 mg/dL   GFR calc non Af Amer 75 (*) >90 mL/min   GFR calc Af Amer 87 (*) >90 mL/min     X-ray: CLINICAL DATA: Fall. Left hip injury and pain.   EXAM:  LEFT HIP - COMPLETE 2+ VIEW   COMPARISON: None.   FINDINGS:  A mildly displaced left femoral neck fracture is seen. No evidence  of dislocation. No pelvic fracture identified. Large calcified  uterine fibroid noted in the central pelvis.   IMPRESSION:  Mildly displaced left femoral neck fracture. Osteopenia.   Electronically Signed  By: Myles Rosenthal M.D.   ROS: per medical admit note Dementia. Unable to appropriately document Recent falls  Blood pressure 124/80, pulse 83, temperature 98.3 F (36.8 C), temperature source Axillary, resp. rate 14, SpO2 95.00%.  Physical Exam  Assessment/Plan: 79 yo female with multiple medical co-morbidities including dementia and recent wrist and shoulder fractures now presenting with a left femoral neck  fracture  Again reviewed risks and benefits of planned/necessary procedure to allow for pain control and mobility with her family Consent to be obtained NPO Left hip hemiarthroplasty today  will return to [facility] after discharge  Demetreus Lothamer D 04/17/2013, 2:07 PM

## 2013-04-18 ENCOUNTER — Inpatient Hospital Stay (HOSPITAL_COMMUNITY): Payer: Medicare Other

## 2013-04-18 DIAGNOSIS — R0902 Hypoxemia: Secondary | ICD-10-CM

## 2013-04-18 LAB — CBC
MCHC: 31.7 g/dL (ref 30.0–36.0)
MCV: 90.6 fL (ref 78.0–100.0)
Platelets: 139 10*3/uL — ABNORMAL LOW (ref 150–400)
RBC: 3.31 MIL/uL — ABNORMAL LOW (ref 3.87–5.11)
RDW: 14.1 % (ref 11.5–15.5)
WBC: 6.4 10*3/uL (ref 4.0–10.5)

## 2013-04-18 LAB — BASIC METABOLIC PANEL
Chloride: 105 mEq/L (ref 96–112)
Creatinine, Ser: 0.64 mg/dL (ref 0.50–1.10)
GFR calc Af Amer: 88 mL/min — ABNORMAL LOW (ref >90)
Potassium: 3.8 mEq/L (ref 3.5–5.1)
Sodium: 140 mEq/L (ref 135–145)

## 2013-04-18 MED ORDER — LEVALBUTEROL HCL 0.63 MG/3ML IN NEBU: 0.6300 mg | INHALATION_SOLUTION | Freq: Four times a day (QID) | RESPIRATORY_TRACT | Status: DC | PRN

## 2013-04-18 MED ORDER — ASPIRIN EC 325 MG PO TBEC
325.0000 mg | DELAYED_RELEASE_TABLET | Freq: Two times a day (BID) | ORAL | Status: DC
  Administered 2013-04-18 – 2013-04-23 (×10): 325 mg via ORAL
  Filled 2013-04-18 (×12): qty 1

## 2013-04-18 MED ORDER — LEVALBUTEROL HCL 0.63 MG/3ML IN NEBU
0.6300 mg | INHALATION_SOLUTION | Freq: Three times a day (TID) | RESPIRATORY_TRACT | Status: DC
  Administered 2013-04-18: 0.63 mg via RESPIRATORY_TRACT
  Filled 2013-04-18 (×3): qty 3

## 2013-04-18 MED ORDER — ASPIRIN 325 MG PO TBEC: 325.0000 mg | DELAYED_RELEASE_TABLET | Freq: Two times a day (BID) | ORAL | Status: DC

## 2013-04-18 MED ORDER — ALBUTEROL SULFATE (5 MG/ML) 0.5% IN NEBU
2.5000 mg | INHALATION_SOLUTION | Freq: Three times a day (TID) | RESPIRATORY_TRACT | Status: DC
  Administered 2013-04-19 – 2013-04-23 (×10): 2.5 mg via RESPIRATORY_TRACT
  Filled 2013-04-18 (×10): qty 0.5

## 2013-04-18 MED ORDER — ALBUTEROL SULFATE (5 MG/ML) 0.5% IN NEBU
2.5000 mg | INHALATION_SOLUTION | Freq: Four times a day (QID) | RESPIRATORY_TRACT | Status: DC | PRN
  Administered 2013-04-19: 2.5 mg via RESPIRATORY_TRACT
  Filled 2013-04-18: qty 0.5

## 2013-04-18 MED ORDER — HYDROCODONE-ACETAMINOPHEN 5-325 MG PO TABS: 1.0000 | ORAL_TABLET | Freq: Four times a day (QID) | ORAL | Status: DC | PRN

## 2013-04-18 NOTE — Op Note (Signed)
NAMELUJEAN, EBRIGHT             ACCOUNT NO.:  [id number]  MEDICAL RECORD NO.:  [id number]  LOCATION:  1613                         FACILITY:  [facility]  PHYSICIAN:  Madlyn Frankel. Charlann Boxer, M.D.  DATE OF BIRTH:  02-13-1921  DATE OF PROCEDURE:  04/17/2013 DATE OF DISCHARGE:                              OPERATIVE REPORT   PREOPERATIVE DIAGNOSIS:  Displaced left femoral neck fracture.  POSTOPERATIVE DIAGNOSIS:  Displaced left femoral neck fracture.  PROCEDURE:  Left hip hemiarthroplasty utilizing DePuy Summit basic standard stem with a size 3 with a 43 unipolar ball with a +0 adapter.  SURGEON:  Madlyn Frankel. Charlann Boxer, M.D.  ASSISTANT:  Lanney Gins, PA-C.  Note that Mr. Carmon Sails was present for the entirety of the case for preoperative position, perioperative management of the operative extremity, general facilitation of the case, and primary wound closure.  ANESTHESIA:  General.  SPECIMENS:  None.  COMPLICATIONS:  None.  DRAINS:  None.  BLOOD LOSS:  Less than 100 mL.  INDICATION FOR PROCEDURE:  Ms. Oceguera is a 79 year old female with multiple medical comorbidities including dementia, who had a ground level fall at the nursing facility where she currently resides.  She had a bruise on the left hip and was brought to the emergency room. Radiographs revealed a left femoral neck fracture.  She was admitted to the hospital through the hospitalist service.  Orthopedics was consulted for definitive management.  Despite her medical comorbidities, it was felt that fixation under stabilization of her hip would provide improved mobility and pain relief.  Consent was obtained after reviewing with family.  PROCEDURE IN DETAIL:  The patient was brought to the operative theater. Once adequate anesthesia, preoperative antibiotics, clindamycin administered, she was positioned into the right lateral decubitus position left side up.  The left lower extremity was then prepped and draped in sterile  fashion.  Time-out was performed identifying the patient, planned procedure, and extremity.  The incision made over lateral trochanter proximal.  Iliotibial band and gluteal fascia were then divided for posterior approach to the hip.  The short external rotators and caps were taken as a single layer.  The fracture site was identified.  Femoral head was removed and measured on the back table to be 43 mm in diameter.  At this point, attention was directed to the proximal femur, where the fractured neck was further osteotomized in orientation of the femoral stem as well as to remove fragmented and comminuted neck segment.  The proximal femur was then opened with a drill, hand reamed, and irrigated to try to prevent fat emboli with broaching.  I began broach with a size 1 broach and then broached up to a size 3 broach with good metaphyseal fit with the 20 degrees of anteversion applied.  The trial reduction was carried out with a standard neck and a 0 adapter 43 ball.  The hip was reduced, found to be stable throughout range of motion without evidence of impingement or subluxation.  At this point, the trial components were removed from the hip.  The final 3 standard Summit basic stem was opened and impacted to the level of the neck cut as where the broach was.  Based on  the trial of +0 adapter was selected, the 43-mm ball and the 0 adapter opened impacted on the back table.  The 2 were then combined onto a clean and dry trunnion.  The hip was reduced.  The hip had been irrigated throughout the case again at this point.  I reapproximated posterior capsule using #1 Vicryl.  The remainder of the wound was closed with #1 Vicryl.  The iliotibial band and gluteal fascia with 2-0 Vicryl and then running 4-0 Monocryl.  The hip was then cleaned, dried, dressed sterilely using Dermabond, Aquacel dressing.  She was brought to the recovery room in stable condition tolerating the procedure well.   Findings were reviewed with family.     Madlyn Frankel Charlann Boxer, M.D.     MDO/MEDQ  D:  04/17/2013  T:  04/18/2013  Job:  [id number]

## 2013-04-18 NOTE — Evaluation (Signed)
Physical Therapy Evaluation Patient Details Name: Peggy Pratt MRN: [id number] DOB: 06-17-1921 Today's Date: 04/18/2013 Time: 4098-1191 PT Time Calculation (min): 25 min  PT Assessment / Plan / Recommendation History of Present Illness  The patient and 79 year old female who presented to the emergency room earlier this morning after a fall she sustained at home. She has a history of progressive dementia and does have full-time care at home as she is unable to perform activities of daily living. Earlier this morning she tripped and fell noting pain and difficulties with the left upper extremity. She was brought to the emergency room for initial evaluation. She was found to have a highly comminuted displaced distal radius fracture  and proximal humerus fx about the left upper extremity.  Clinical Impression  Pt s/p L hip fx and hemiarthroplasty presents with decreased L LE strength/ROM, post op pain and premorbid dementia limiting functional mobility.  Pt will require follow up rehab at SNF level.    PT Assessment  Patient needs continued PT services    Follow Up Recommendations  SNF    Does the patient have the potential to tolerate intense rehabilitation      Barriers to Discharge        Equipment Recommendations  None recommended by PT    Recommendations for Other Services OT consult   Frequency Min 3X/week    Precautions / Restrictions Precautions Precautions: Posterior Hip;Fall Restrictions Weight Bearing Restrictions: No Other Position/Activity Restrictions: WBAT   Pertinent Vitals/Pain Pt unalbe to rate but expressing pain with mobilization to EOB and to chair.      Mobility  Bed Mobility Bed Mobility: Supine to Sit Supine to Sit: 2: Max assist Details for Bed Mobility Assistance: utilized pad to assist pt to EOB Transfers Transfers: Sit to Stand;Stand to Sit Sit to Stand: 1: +2 Total assist Sit to Stand: Patient Percentage: 10% Stand to Sit: 1: +2 Total  assist Stand to Sit: Patient Percentage: 10% Stand Pivot Transfers: 1: +2 Total assist Stand Pivot Transfers: Patient Percentage: 10% Details for Transfer Assistance: Pt tolerating min weight on L LE Ambulation/Gait Ambulation/Gait Assistance: Not tested (comment)    Exercises Total Joint Exercises Ankle Circles/Pumps: AAROM;10 reps;Supine;Both Heel Slides: AAROM;15 reps;Left;Supine Hip ABduction/ADduction: AAROM;Left;15 reps;Supine   PT Diagnosis:    PT Problem List: Decreased strength;Decreased range of motion;Decreased activity tolerance;Decreased mobility;Decreased knowledge of use of DME;Pain;Decreased knowledge of precautions PT Treatment Interventions: DME instruction;Gait training;Functional mobility training;Therapeutic activities;Therapeutic exercise;Patient/family education     PT Goals(Current goals can be found in the care plan section) Acute Rehab PT Goals Patient Stated Goal: Pt unable to state PT Goal Formulation: Patient unable to participate in goal setting Time For Goal Achievement: 04/25/13 Potential to Achieve Goals: Fair  Visit Information  Last PT Received On: 04/18/13 Assistance Needed: +2 History of Present Illness: The patient and 79 year old female who presented to the emergency room earlier this morning after a fall she sustained at home. She has a history of progressive dementia and does have full-time care at home as she is unable to perform activities of daily living. Earlier this morning she tripped and fell noting pain and difficulties with the left upper extremity. She was brought to the emergency room for initial evaluation. She was found to have a highly comminuted displaced distal radius fracture  and proximal humerus fx about the left upper extremity.       Prior Functioning  Home Living Family/patient expects to be discharged to:: Skilled nursing facility Prior Function Level  of Independence: Needs assistance Communication Communication:  Other (comment) (demented)    Cognition  Cognition Arousal/Alertness: Lethargic Behavior During Therapy: WFL for tasks assessed/performed Overall Cognitive Status: History of cognitive impairments - at baseline    Extremity/Trunk Assessment Upper Extremity Assessment Upper Extremity Assessment: Defer to OT evaluation Lower Extremity Assessment Lower Extremity Assessment: LLE deficits/detail LLE Deficits / Details: Pt assisting with movement and tolerating 75 hip flex and 15 abd.  Pt does not follow cues to accurately assess muscle strength   Balance Static Sitting Balance Static Sitting - Balance Support: Bilateral upper extremity supported Static Sitting - Level of Assistance: 4: Min assist;5: Stand by assistance Static Sitting - Comment/# of Minutes: 8  End of Session PT - End of Session Equipment Utilized During Treatment: Gait belt Activity Tolerance: Patient limited by pain;Patient limited by fatigue Patient left: in chair;with call bell/phone within reach Nurse Communication: Mobility status  GP     Jeff Mccallum 04/18/2013, 1:18 PM

## 2013-04-18 NOTE — Progress Notes (Signed)
OT Cancellation Note  Patient Details Name: Peggy Pratt MRN: [id number] DOB: 1921/06/23   Cancelled Treatment:    Reason Eval/Treat Not Completed: Other (comment)  Pt is moving slowly; plan is back to snf.  Will defer OT eval to that venue.    Darshawn Boateng 04/18/2013, 1:08 PM Marica Otter, OTR/L [phone] 04/18/2013

## 2013-04-18 NOTE — Progress Notes (Addendum)
[facility] PROGRESS NOTE  Peggy Pratt ZOX:[id number] DOB: [birth year] DOA: 04/16/2013 PCP: Ailene Ravel, MD  Assessment/Plan:  1. L femur neck fracture -Status post left hip hemiarthroplasty per Dr.Olin on 10/14 - DVT proph post op hip-per orthopedics -Appreciate orthopedic assistance -Follow and recheck hemoglobin  2.Hypoxia -Patient requiring Ventimask, will obtain chest x-ray to further evaluate -When necessary nebs, incentive spirometry if patient able to do -Wean O2 down as tolerated and follow  3. Advanced Dementia -stable, continue namenda  -xanax PRN  4. Recent L humerus and wrist Fx  5. Frequent falls -likely from progressive dementia   Code Status: DNR Family Communication: none at bedside Disposition Plan: will likely need SNF   Consultants:  Ortho Dr.Olin  HPI/Subjective: Confused, with veni mask on. Denies any c/o  Objective: Filed Vitals:   04/18/13 0628  BP:   Pulse: 83  Temp:   Resp: 15    Intake/Output Summary (Last 24 hours) at 04/18/13 1303 Last data filed at 04/18/13 1140  Gross per 24 hour  Intake 2646.67 ml  Output    655 ml  Net 1991.67 ml   There were no vitals filed for this visit.  Exam:   General:  Keeps eyes closed but answers simple questions, mumbling intermittently  Cardiovascular: S1S2/RRR  Respiratory: diminished at bases  Abdomen: soft, NT  Musculoskeletal: L leg shorter and rotated  Data Reviewed: Basic Metabolic Panel:  Recent Labs Lab 04/16/13 2007 04/17/13 0740 04/18/13 0531  NA 138  --  140  K 4.0  --  3.8  CL 102  --  105  CO2 28  --  28  GLUCOSE 143*  --  138*  BUN 26*  --  16  CREATININE 0.85 0.67 0.64  CALCIUM 8.9  --  8.1*   Liver Function Tests:  Recent Labs Lab 04/16/13 2007  AST 26  ALT 16  ALKPHOS 88  BILITOT 0.5  PROT 6.8  ALBUMIN 3.0*   No results found for this basename: LIPASE, AMYLASE,  in the last 168 hours No results found for this basename:  AMMONIA,  in the last 168 hours CBC:  Recent Labs Lab 04/16/13 2007 04/17/13 0740 04/18/13 0531  WBC 10.3 8.5 6.4  HGB 12.0 11.4* 9.5*  HCT 36.0 34.9* 30.0*  MCV 89.6 89.7 90.6  PLT 176 169 139*   Cardiac Enzymes: No results found for this basename: CKTOTAL, CKMB, CKMBINDEX, TROPONINI,  in the last 168 hours BNP (last 3 results) No results found for this basename: PROBNP,  in the last 8760 hours CBG: No results found for this basename: GLUCAP,  in the last 168 hours  Recent Results (from the past 240 hour(s))  SURGICAL PCR SCREEN     Status: None   Collection Time    04/17/13  2:18 PM      Result Value Range Status   MRSA, PCR NEGATIVE  NEGATIVE Final   Staphylococcus aureus NEGATIVE  NEGATIVE Final   Comment:            The Xpert SA Assay (FDA     approved for NASAL specimens     in patients over 57 years of age),     is one component of     a comprehensive surveillance     program.  Test performance has     been validated by [employer] for patients greater     than or equal to 66 year old.     It  is not intended     to diagnose infection nor to     guide or monitor treatment.     Studies: Dg Chest 1 View  04/16/2013   CLINICAL DATA:  Fall with right-sided rib pain.  EXAM: CHEST - 1 VIEW  COMPARISON:  02/02/2013  FINDINGS: The heart is enlarged. There is moderate vascular congestion without overt CHF. The bones are demineralized. Chronic left humeral neck fracture. An acute rib fracture on the right is not visible. There is no pneumothorax or effusion.  IMPRESSION: Stable cardiomegaly and osseous findings. No acute rib fracture. Moderate vascular congestion.   Electronically Signed   By: Davonna Belling M.D.   On: 04/16/2013 20:13   Dg Hip Complete Left  04/16/2013   CLINICAL DATA:  Fall. Left hip injury and pain.  EXAM: LEFT HIP - COMPLETE 2+ VIEW  COMPARISON:  None.  FINDINGS: A mildly displaced left femoral neck fracture is seen. No evidence of dislocation. No  pelvic fracture identified. Large calcified uterine fibroid noted in the central pelvis.  IMPRESSION: Mildly displaced left femoral neck fracture. Osteopenia.   Electronically Signed   By: Myles Rosenthal M.D.   On: 04/16/2013 20:09   Ct Head Wo Contrast  04/16/2013   CLINICAL DATA:  Decreased level of consciousness. Alzheimer's disease.  EXAM: CT HEAD WITHOUT CONTRAST  TECHNIQUE: Contiguous axial images were obtained from the base of the skull through the vertex without contrast.  COMPARISON:  MR brain 04/03/2010.  FINDINGS: No evidence for acute infarction, hemorrhage, mass lesion, hydrocephalus, or extra-axial fluid. Moderate atrophy. Chronic microvascular ischemic change. Vascular calcification. Calvarium intact. Clear sinuses and mastoids.  IMPRESSION: No acute intracranial abnormality. Atrophy and small vessel disease. Similar appearance to priors.   Electronically Signed   By: Davonna Belling M.D.   On: 04/16/2013 20:25   Dg Pelvis Portable  04/17/2013   CLINICAL DATA:  Status post left hip replacement.  EXAM: PORTABLE PELVIS  COMPARISON:  Single view of the pelvis 11/22/2010.  FINDINGS: The patient has a new bipolar left hip hemiarthroplasty. The device is located. There is no fracture. Gas the soft tissues from surgery is noted. Calcified uterine fibroid also seen.  IMPRESSION: Left hip replacement without acute abnormality.   Electronically Signed   By: Drusilla Kanner M.D.   On: 04/17/2013 18:18    Scheduled Meds: . aspirin EC  325 mg Oral BID AC  . cholecalciferol  1,000 Units Oral Daily  . citalopram  20 mg Oral Daily  . docusate sodium  100 mg Oral BID  . feeding supplement (ENSURE COMPLETE)  237 mL Oral BID BM  . memantine  10 mg Oral BID  . multivitamin with minerals  1 tablet Oral Daily   Continuous Infusions: . dextrose 5 % and 0.9% NaCl 50 mL/hr at 04/17/13 2149  . lactated ringers      Principal Problem:   Fracture of left hip requiring operative repair Active Problems:    Alzheimer's dementia   Recurrent falls   Heart murmur   Thrombocytopenia, unspecified   DNR (do not resuscitate)    Time spent:    [facility]  [facility] Pager [phone]. If 7PM-7AM, please contact night-coverage at [email or web address], password [facility] 04/18/2013, 1:03 PM  LOS: 2 days

## 2013-04-18 NOTE — Progress Notes (Signed)
   Subjective: 1 Day Post-Op Procedure(s) (LRB): ARTHROPLASTY BIPOLAR HIP (Left)   Patient resting comfortably in bed. No reported events throughout the night.  Objective:   VITALS:   Filed Vitals:   04/18/13  BP: 148/72  Pulse: 83  Temp: 98.8 F (37.1 C)   Resp: 15    Incision: dressing C/D/I No cellulitis present Compartment soft  LABS  Recent Labs  04/16/13 2007 04/17/13 0740 04/18/13 0531  HGB 12.0 11.4* 9.5*  HCT 36.0 34.9* 30.0*  WBC 10.3 8.5 6.4  PLT 176 169 139*     Recent Labs  04/16/13 2007 04/17/13 0740 04/18/13 0531  NA 138  --  140  K 4.0  --  3.8  BUN 26*  --  16  CREATININE 0.85 0.67 0.64  GLUCOSE 143*  --  138*     Assessment/Plan: 1 Day Post-Op Procedure(s) (LRB): ARTHROPLASTY BIPOLAR HIP (Left)  Advance diet Up with therapy Discharge to SNF eventually, when ready   Anastasio Auerbach. Amenah Tucci   PAC  04/18/2013, 8:59 AM

## 2013-04-18 NOTE — ED Provider Notes (Signed)
I saw and evaluated the patient, reviewed the resident's note and I agree with the findings and plan. Pt s/p recent fall w left hip fracture. Distal pulses palp. Pain controlled w meds. Spine nt. Admit.   Suzi Roots, MD 04/18/13 [phone]

## 2013-04-18 NOTE — Care Management Note (Signed)
    Page 1 of 1   04/18/2013     12:28:43 PM   CARE MANAGEMENT NOTE 04/18/2013  Patient:  Peggy Pratt, Peggy Pratt   Account Number:  [id number]  Date Initiated:  04/18/2013  Documentation initiated by:  Colleen Can  Subjective/Objective Assessment:   dx displaced left femoraql neck fracture; left hip hemiarthroplasty. hx advanced dementia.     Action/Plan:   Spoke with pt's daughter-in-law. Pt is resident of [facility]. Plans are for her to return to same facility. Referral to CSW.   Anticipated DC Date:  04/19/2013   Anticipated DC Plan:  SKILLED NURSING FACILITY  In-house referral  Clinical Social Worker      DC Planning Services  CM consult      Choice offered to / List presented to:             Status of service:  Completed, signed off Medicare Important Message given?   (If response is "NO", the following Medicare IM given date fields will be blank) Date Medicare IM given:   Date Additional Medicare IM given:    Discharge Disposition:    Per UR Regulation:  Reviewed for med. necessity/level of care/duration of stay  If discussed at Long Length of Stay Meetings, dates discussed:    Comments:

## 2013-04-18 NOTE — Progress Notes (Signed)
Physical Therapy Treatment Patient Details Name: Peggy Pratt MRN: [id number] DOB: 30-Aug-1920 Today's Date: 04/18/2013 Time: 4098-1191 PT Time Calculation (min): 14 min  PT Assessment / Plan / Recommendation  History of Present Illness The patient and 79 year old female who presented to the emergency room earlier this morning after a fall she sustained at home. She has a history of progressive dementia and does have full-time care at home as she is unable to perform activities of daily living. Earlier this morning she tripped and fell noting pain and difficulties with the left upper extremity. She was brought to the emergency room for initial evaluation. She was found to have a highly comminuted displaced distal radius fracture  and proximal humerus fx about the left upper extremity.   PT Comments     Follow Up Recommendations  SNF     Does the patient have the potential to tolerate intense rehabilitation     Barriers to Discharge        Equipment Recommendations  None recommended by PT    Recommendations for Other Services OT consult  Frequency Min 3X/week   Progress towards PT Goals Progress towards PT goals: Progressing toward goals  Plan Current plan remains appropriate    Precautions / Restrictions Precautions Precautions: Posterior Hip;Fall Restrictions Weight Bearing Restrictions: No Other Position/Activity Restrictions: WBAT   Pertinent Vitals/Pain Pt indicates discomfort with movement but unable to rate    Mobility  Bed Mobility Bed Mobility: Sit to Supine Supine to Sit: 2: Max assist Sit to Supine: 1: +2 Total assist Sit to Supine: Patient Percentage: 0% Details for Bed Mobility Assistance: utilized pad to assist pt to EOB Transfers Transfers: Sit to Stand;Stand to Sit Sit to Stand: 1: +2 Total assist Sit to Stand: Patient Percentage: 10% Stand to Sit: 1: +2 Total assist Stand to Sit: Patient Percentage: 10% Stand Pivot Transfers: 1: +2 Total  assist Stand Pivot Transfers: Patient Percentage: 10% Details for Transfer Assistance: Pt tolerating min weight on L LE Ambulation/Gait Ambulation/Gait Assistance: Not tested (comment) Stairs: No    Exercises Total Joint Exercises Ankle Circles/Pumps: AAROM;10 reps;Supine;Both Heel Slides: AAROM;15 reps;Left;Supine Hip ABduction/ADduction: AAROM;Left;15 reps;Supine   PT Diagnosis:    PT Problem List: Decreased strength;Decreased range of motion;Decreased activity tolerance;Decreased mobility;Decreased knowledge of use of DME;Pain;Decreased knowledge of precautions PT Treatment Interventions: DME instruction;Gait training;Functional mobility training;Therapeutic activities;Therapeutic exercise;Patient/family education   PT Goals (current goals can now be found in the care plan section) Acute Rehab PT Goals Patient Stated Goal: Pt unable to state PT Goal Formulation: Patient unable to participate in goal setting Time For Goal Achievement: 04/25/13 Potential to Achieve Goals: Fair  Visit Information  Last PT Received On: 04/18/13 Assistance Needed: +2 History of Present Illness: The patient and 79 year old female who presented to the emergency room earlier this morning after a fall she sustained at home. She has a history of progressive dementia and does have full-time care at home as she is unable to perform activities of daily living. Earlier this morning she tripped and fell noting pain and difficulties with the left upper extremity. She was brought to the emergency room for initial evaluation. She was found to have a highly comminuted displaced distal radius fracture  and proximal humerus fx about the left upper extremity.    Subjective Data  Patient Stated Goal: Pt unable to state   Cognition  Cognition Arousal/Alertness: Lethargic Behavior During Therapy: WFL for tasks assessed/performed Overall Cognitive Status: History of cognitive impairments - at baseline  Balance   Static Sitting Balance Static Sitting - Balance Support: Bilateral upper extremity supported Static Sitting - Level of Assistance: 4: Min assist;5: Stand by assistance Static Sitting - Comment/# of Minutes: 3  End of Session PT - End of Session Equipment Utilized During Treatment: Gait belt Activity Tolerance: Patient limited by pain;Patient limited by fatigue Patient left: in bed;with call bell/phone within reach Nurse Communication: Mobility status   GP     Yerania Chamorro 04/18/2013, 3:44 PM

## 2013-04-19 ENCOUNTER — Encounter (HOSPITAL_COMMUNITY): Payer: Self-pay | Admitting: Orthopedic Surgery

## 2013-04-19 LAB — CBC
HCT: 27.9 % — ABNORMAL LOW (ref 36.0–46.0)
Hemoglobin: 9.1 g/dL — ABNORMAL LOW (ref 12.0–15.0)
MCH: 29.5 pg (ref 26.0–34.0)
MCHC: 32.6 g/dL (ref 30.0–36.0)
MCV: 90.6 fL (ref 78.0–100.0)
RDW: 14.4 % (ref 11.5–15.5)

## 2013-04-19 LAB — BASIC METABOLIC PANEL
BUN: 14 mg/dL (ref 6–23)
Creatinine, Ser: 0.55 mg/dL (ref 0.50–1.10)
GFR calc Af Amer: >90 mL/min (ref >90)
GFR calc non Af Amer: 80 mL/min — ABNORMAL LOW (ref >90)
Glucose, Bld: 144 mg/dL — ABNORMAL HIGH (ref 70–99)
Potassium: 3.5 mEq/L (ref 3.5–5.1)

## 2013-04-19 MED ORDER — FUROSEMIDE 10 MG/ML IJ SOLN
20.0000 mg | Freq: Once | INTRAMUSCULAR | Status: AC
  Administered 2013-04-19: 13:00:00 20 mg via INTRAVENOUS
  Filled 2013-04-19: qty 2

## 2013-04-19 NOTE — Progress Notes (Signed)
   Subjective: 2 Days Post-Op Procedure(s) (LRB): ARTHROPLASTY BIPOLAR HIP (Left)   Patient resting comfortably in bed. No reported events throughout the night.     Objective:   VITALS:   Filed Vitals:   04/19/13  BP: 102/75  Pulse: 75  Temp: 99 F (37.2 C)  Resp: 20    Incision: dressing C/D/I No cellulitis present Compartment soft  LABS  Recent Labs  04/17/13 0740 04/18/13 0531 04/19/13 0555  HGB 11.4* 9.5* 9.1*  HCT 34.9* 30.0* 27.9*  WBC 8.5 6.4 7.2  PLT 169 139* 149*     Recent Labs  04/16/13 2007 04/17/13 0740 04/18/13 0531 04/19/13 0555  NA 138  --  140 137  K 4.0  --  3.8 3.5  BUN 26*  --  16 14  CREATININE 0.85 0.67 0.64 0.55  GLUCOSE 143*  --  138* 144*     Assessment/Plan: 2 Days Post-Op Procedure(s) (LRB): ARTHROPLASTY BIPOLAR HIP (Left) Advance diet  Up with therapy  Discharge to SNF eventually, when medically ready Orthopaedically stable ASA bid for anticoagulation, bid for 4 weeks. Norco for pain management, Rx written. WBAT left leg Follow up in 2 weeks at [facility]. Follow up with OLIN,Gianno Volner D in 2 weeks.  Contact information:  [facility] [address], [address] [place] [address] [phone]            Anastasio Auerbach. Mickel Schreur   PAC  04/19/2013, 8:20 AM

## 2013-04-19 NOTE — H&P (Deleted)
Pt having frequent episodes of desatting into the high 80s on 4 liters . Was told by previous RN that pt was having depressed respiratory symptoms d/t recent analgesics/anesthesia administration and that sats had been borderline 90s all day. D/t pts inability to maintain adequate oxygen saturation at this time, pt is placed on Veni-Mask at 50%. o2 sats are improved to 96% at this time

## 2013-04-19 NOTE — Progress Notes (Signed)
Per report from previous RN, pt has had low urine output for the past 24hrs and MD had been aware, yet still ordered foley to be discontinued. Pt hasn't voided since catheter was d/c'd at 2200 on 04/18/13 and bladder scan reveals 180cc of urine present in bladder at this time. NP on call has been text paged and made aware of pt condition. Will aware orders and continue to monitor pt condition.

## 2013-04-19 NOTE — Progress Notes (Signed)
Pt having frequent episodes of desatting into the high 80s on 4 liters [place]. Was told by previous RN that pt was having depressed respiratory symptoms d/t recent analgesics/anesthesia administration and that sats had been borderline 90s all day. D/t pts inability to maintain adequate oxygen saturation at this time, pt is placed on Veni-Mask at 50%. o2 sats are improved to 96% at this time 

## 2013-04-19 NOTE — Progress Notes (Signed)
[facility] PROGRESS NOTE  Peggy Pratt RUE:[id number] DOB: Apr 17, 1921 DOA: 04/16/2013 PCP: Ailene Ravel, MD  Assessment/Plan:  1. L femur neck fracture -Status post left hip hemiarthroplasty per Dr.Olin on 10/14 - DVT proph post op hip-per orthopedics -Appreciate orthopedic assistance - hemoglobin remained stable at 9.1  2.Hypoxia -Patient requiring Ventimask, will obtain chest x-ray to further evaluate -continue necessary nebs, incentive spirometry if patient able to do -Chest x-ray with atelectasis vs edema versus infection. Less likely infection given that she is afebrile with no leukocytosis - Will Hep-Lock IV fluids give dose of Lasix, follow and continue to wean down O2.  3. Advanced Dementia -stable, continue namenda  -xanax PRN  4. Recent L humerus and wrist Fx  5. Frequent falls -likely from progressive dementia   Code Status: DNR Family Communication: none at bedside Disposition Plan:likely SNF   Consultants:  Ortho Dr.Olin  HPI/Subjective: -More alert today, still some intermittent confusion but better  Objective: Filed Vitals:   04/19/13 0514  BP: 102/75  Pulse: 75  Temp: 99 F (37.2 C)  Resp:     Intake/Output Summary (Last 24 hours) at 04/19/13 1108 Last data filed at 04/19/13 0600  Gross per 24 hour  Intake 1776.66 ml  Output    350 ml  Net 1426.66 ml   There were no vitals filed for this visit.  Exam:   General:  More alert today, disoriented but answers some simple questions appropriately.  Cardiovascular: S1S2/RRR  Respiratory: diminished at bases  Abdomen: soft, NT  Musculoskeletal: No cyanosis and no edema  Data Reviewed: Basic Metabolic Panel:  Recent Labs Lab 04/16/13 2007 04/17/13 0740 04/18/13 0531 04/19/13 0555  NA 138  --  140 137  K 4.0  --  3.8 3.5  CL 102  --  105 105  CO2 28  --  28 26  GLUCOSE 143*  --  138* 144*  BUN 26*  --  16 14  CREATININE 0.85 0.67 0.64 0.55  CALCIUM 8.9  --   8.1* 8.1*   Liver Function Tests:  Recent Labs Lab 04/16/13 2007  AST 26  ALT 16  ALKPHOS 88  BILITOT 0.5  PROT 6.8  ALBUMIN 3.0*   No results found for this basename: LIPASE, AMYLASE,  in the last 168 hours No results found for this basename: AMMONIA,  in the last 168 hours CBC:  Recent Labs Lab 04/16/13 2007 04/17/13 0740 04/18/13 0531 04/19/13 0555  WBC 10.3 8.5 6.4 7.2  HGB 12.0 11.4* 9.5* 9.1*  HCT 36.0 34.9* 30.0* 27.9*  MCV 89.6 89.7 90.6 90.6  PLT 176 169 139* 149*   Cardiac Enzymes: No results found for this basename: CKTOTAL, CKMB, CKMBINDEX, TROPONINI,  in the last 168 hours BNP (last 3 results) No results found for this basename: PROBNP,  in the last 8760 hours CBG: No results found for this basename: GLUCAP,  in the last 168 hours  Recent Results (from the past 240 hour(s))  SURGICAL PCR SCREEN     Status: None   Collection Time    04/17/13  2:18 PM      Result Value Range Status   MRSA, PCR NEGATIVE  NEGATIVE Final   Staphylococcus aureus NEGATIVE  NEGATIVE Final   Comment:            The Xpert SA Assay (FDA     approved for NASAL specimens     in patients over 53 years of age),     is one component  of     a comprehensive surveillance     program.  Test performance has     been validated by [facility] for patients greater     than or equal to 37 year old.     It is not intended     to diagnose infection nor to     guide or monitor treatment.     Studies: Dg Pelvis Portable  04/17/2013   CLINICAL DATA:  Status post left hip replacement.  EXAM: PORTABLE PELVIS  COMPARISON:  Single view of the pelvis 11/22/2010.  FINDINGS: The patient has a new bipolar left hip hemiarthroplasty. The device is located. There is no fracture. Gas the soft tissues from surgery is noted. Calcified uterine fibroid also seen.  IMPRESSION: Left hip replacement without acute abnormality.   Electronically Signed   By: Drusilla Kanner M.D.   On: 04/17/2013 18:18    Dg Chest Port 1 View  04/18/2013   CLINICAL DATA:  Hypoxia question infiltrates, weakness, shortness of breath, history Alzheimer's  EXAM: PORTABLE CHEST - 1 VIEW  COMPARISON:  Portable exam 2004 hr compared to 04/16/2013  FINDINGS: Enlargement of cardiac silhouette with pulmonary vascular congestion.  Atherosclerotic calcification aorta.  Scattered interstitial infiltrates question edema versus infection.  More focal consolidation over atelectasis present at bases greater on left.  No pneumothorax or definite pleural effusion.  Significant osseous demineralization with posttraumatic deformity of the proximal left humerus.  IMPRESSION: Enlargement of cardiac silhouette.  Bibasilar atelectasis versus consolidation with suspect bilateral diffuse infiltrates which could represent edema or infection.   Electronically Signed   By: Ulyses Southward M.D.   On: 04/18/2013 22:00    Scheduled Meds: . albuterol  2.5 mg Nebulization TID  . aspirin EC  325 mg Oral BID AC  . cholecalciferol  1,000 Units Oral Daily  . citalopram  20 mg Oral Daily  . docusate sodium  100 mg Oral BID  . feeding supplement (ENSURE COMPLETE)  237 mL Oral BID BM  . furosemide  20 mg Intravenous Once  . memantine  10 mg Oral BID  . multivitamin with minerals  1 tablet Oral Daily   Continuous Infusions:    Principal Problem:   Fracture of left hip requiring operative repair Active Problems:   Alzheimer's dementia   Recurrent falls   Heart murmur   Thrombocytopenia, unspecified   DNR (do not resuscitate)    Time spent:    [facility]  [facility] Pager [phone]. If 7PM-7AM, please contact night-coverage at [email or web address], password [facility] 04/19/2013, 11:08 AM  LOS: 3 days

## 2013-04-20 DIAGNOSIS — R404 Transient alteration of awareness: Secondary | ICD-10-CM

## 2013-04-20 LAB — PRO B NATRIURETIC PEPTIDE: Pro B Natriuretic peptide (BNP): 2931 pg/mL — ABNORMAL HIGH (ref 0–450)

## 2013-04-20 LAB — BASIC METABOLIC PANEL
BUN: 13 mg/dL (ref 6–23)
CO2: 30 mEq/L (ref 19–32)
Calcium: 8.5 mg/dL (ref 8.4–10.5)
Creatinine, Ser: 0.63 mg/dL (ref 0.50–1.10)
GFR calc non Af Amer: 77 mL/min — ABNORMAL LOW (ref >90)
Glucose, Bld: 107 mg/dL — ABNORMAL HIGH (ref 70–99)
Potassium: 3.7 mEq/L (ref 3.5–5.1)
Sodium: 140 mEq/L (ref 135–145)

## 2013-04-20 MED ORDER — RESOURCE THICKENUP CLEAR PO POWD
ORAL | Status: DC | PRN
  Filled 2013-04-20: qty 125

## 2013-04-20 MED ORDER — FUROSEMIDE 10 MG/ML IJ SOLN
40.0000 mg | Freq: Once | INTRAMUSCULAR | Status: AC
  Administered 2013-04-20: 12:00:00 40 mg via INTRAVENOUS
  Filled 2013-04-20: qty 4

## 2013-04-20 MED ORDER — STARCH (THICKENING) PO POWD: ORAL | Status: DC | PRN

## 2013-04-20 MED ORDER — FUROSEMIDE 40 MG PO TABS
40.0000 mg | ORAL_TABLET | Freq: Every day | ORAL | Status: DC
  Administered 2013-04-21 – 2013-04-23 (×3): 40 mg via ORAL
  Filled 2013-04-20 (×3): qty 1

## 2013-04-20 NOTE — Progress Notes (Signed)
Physical Therapy Treatment Patient Details Name: Peggy Pratt MRN: [id number] DOB: 1920-09-28 Today's Date: 04/20/2013 Time: 4098-1191 PT Time Calculation (min): 34 min  PT Assessment / Plan / Recommendation  History of Present Illness The patient and 79 year old female who presented to the emergency room earlier this morning after a fall she sustained at home. She has a history of progressive dementia and does have full-time care at home as she is unable to perform activities of daily living. Earlier this morning she tripped and fell noting pain and difficulties with the left upper extremity. She was brought to the emergency room for initial evaluation. She was found to have a highly comminuted displaced distal radius fracture  and proximal humerus fx about the left upper extremity.   PT Comments   Pt continues lethargic but able to rouse and with increased ability to participate including ambulating into hall with RW and assist of 2.  Follow Up Recommendations  SNF     Does the patient have the potential to tolerate intense rehabilitation     Barriers to Discharge        Equipment Recommendations  None recommended by PT    Recommendations for Other Services OT consult  Frequency Min 3X/week   Progress towards PT Goals Progress towards PT goals: Progressing toward goals  Plan Current plan remains appropriate    Precautions / Restrictions Precautions Precautions: Posterior Hip;Fall Restrictions Weight Bearing Restrictions: No Other Position/Activity Restrictions: WBAT   Pertinent Vitals/Pain Min indication of pain    Mobility  Bed Mobility Bed Mobility: Supine to Sit Supine to Sit: 1: +2 Total assist Supine to Sit: Patient Percentage: 0% Details for Bed Mobility Assistance: utilized pad to bring pt to EOB and sit up Transfers Transfers: Sit to Stand;Stand to Sit Sit to Stand: 1: +2 Total assist Sit to Stand: Patient Percentage: 40% Stand to Sit: 1: +2 Total  assist Stand to Sit: Patient Percentage: 40% Details for Transfer Assistance: pt following min VC but follows and assists when movement physically initiated Ambulation/Gait Ambulation/Gait Assistance: 1: +2 Total assist Ambulation/Gait: Patient Percentage: 50% Ambulation Distance (Feet): 28 Feet Assistive device: Rolling walker Ambulation/Gait Assistance Details: assist for balance, support, and RW management Gait Pattern: Step-to pattern;Step-through pattern;Decreased step length - right;Decreased step length - left;Shuffle;Trunk flexed Stairs: No    Exercises Total Joint Exercises Ankle Circles/Pumps: AAROM;10 reps;Supine;Both Heel Slides: AAROM;15 reps;Left;Supine Hip ABduction/ADduction: AAROM;Left;15 reps;Supine   PT Diagnosis:    PT Problem List:   PT Treatment Interventions:     PT Goals (current goals can now be found in the care plan section) Acute Rehab PT Goals Patient Stated Goal: Pt unable to state PT Goal Formulation: Patient unable to participate in goal setting Time For Goal Achievement: 04/25/13 Potential to Achieve Goals: Fair  Visit Information  Last PT Received On: 04/20/13 Assistance Needed: +2 History of Present Illness: The patient and 79 year old female who presented to the emergency room earlier this morning after a fall she sustained at home. She has a history of progressive dementia and does have full-time care at home as she is unable to perform activities of daily living. Earlier this morning she tripped and fell noting pain and difficulties with the left upper extremity. She was brought to the emergency room for initial evaluation. She was found to have a highly comminuted displaced distal radius fracture  and proximal humerus fx about the left upper extremity.    Subjective Data  Patient Stated Goal: Pt unable to state  Cognition  Cognition Arousal/Alertness: Lethargic Behavior During Therapy: WFL for tasks assessed/performed Overall Cognitive  Status: History of cognitive impairments - at baseline    Insurance risk surveyor Sitting - Balance Support: Bilateral upper extremity supported Static Sitting - Level of Assistance: 4: Min assist;5: Stand by assistance Static Sitting - Comment/# of Minutes: 5  End of Session PT - End of Session Equipment Utilized During Treatment: Gait belt Activity Tolerance: Patient limited by fatigue;Patient tolerated treatment well Patient left: in chair;with call bell/phone within reach Nurse Communication: Mobility status   GP     Abri Vacca 04/20/2013, 2:29 PM

## 2013-04-20 NOTE — Evaluation (Signed)
Clinical/Bedside Swallow Evaluation Patient Details  Name: Peggy Pratt MRN: [id number] Date of Birth: 01-22-1921  Today's Date: 04/20/2013 Time: 4098-1191 SLP Time Calculation (min): 41 min  Past Medical History:  Past Medical History  Diagnosis Date  . Alzheimer's dementia   . Recurrent falls   . Compression fracture    Past Surgical History:  Past Surgical History  Procedure Laterality Date  . Wrist surgery Right   . Cataract extraction, bilateral    . Hip arthroplasty Left 04/17/2013    Procedure: ARTHROPLASTY BIPOLAR HIP;  Surgeon: Shelda Pal, MD;  Location: [facility];  Service: Orthopedics;  Laterality: Left;   HPI:  Peggy Pratt is an 79 y.o. female with hx of advanced dementia, osteoporosis, recents falls, SNF resident with DNR code statuts, recent wrist and shouldder Fx, fell unwitnessed at the SNF last Tuesday, brought to the ER as hip Xray finally showed displaced Fx of the left hip.  She had 2 prior xrays which didn't show any Fx, but this last one did.  She was given Fentanyl via EMS, and now is lethargic and unable converse.  Her daughter in law (a [facility]) gave me most of her history.  X ray repeated here showed slightly displaced left humeral neck Fx.  Her head CT was negative, and serology was unremarkable.  Her CXR showed mild congestion.  Orthopedics were consulted and will see her in the am, with plan to do surgery early tomorrow morning.  Hospitalist was asked to admit her for hip Fx's pathway   Assessment / Plan / Recommendation Clinical Impression  Pt. reveals s/s of aspiration (clinically) with nectar thick liquids, with possible aspiration with all consistencies (audible throat clearing after honey thick).  Pt. was unable to chew a small piece of cracker due to cognitive deficits.  Pt. appears to be hallucinating at times, stating, "Look at all those people.  Right over there!"  Pt. has a hoarse voice quality, with low vocal intensity noted.  Question if  this is her baseline vs. a change s/p intubation.  (Pt. is unable to provide any history and there are no family memebers available).  Will downgrade to a more conservative diet, and post aspiration precautions at [facility], however, pt. may remain at risk for aspiration due to limitations identified as well as lethargy and cognitive status.  Consider Palliative Care Consult.    Aspiration Risk  Moderate    Diet Recommendation Dysphagia 1 (Puree);Honey-thick liquid   Liquid Administration via: Spoon Medication Administration: Crushed with puree Supervision: Staff to assist with self feeding;Full supervision/cueing for compensatory strategies Compensations: Slow rate;Small sips/bites;Check for pocketing Postural Changes and/or Swallow Maneuvers: Seated upright 90 degrees    Other  Recommendations Recommended Consults:  (Palliative Care) Oral Care Recommendations: Oral care before and after PO;Staff/trained caregiver to provide oral care Other Recommendations: Order thickener from pharmacy;Clarify dietary restrictions;Prohibited food (jello, ice cream, thin soups)   Follow Up Recommendations  Skilled Nursing facility    Frequency and Duration min 3x week  2 weeks   Pertinent Vitals/Pain n/a    SLP Swallow Goals     Swallow Study Prior Functional Status       General HPI: Peggy Pratt is an 79 y.o. female with hx of advanced dementia, osteoporosis, recents falls, SNF resident with DNR code statuts, recent wrist and shouldder Fx, fell unwitnessed at the SNF last Tuesday, brought to the ER as hip Xray finally showed displaced Fx of the left hip.  She had  2 prior xrays which didn't show any Fx, but this last one did.  She was given Fentanyl via EMS, and now is lethargic and unable converse.  Her daughter in law (a [facility]) gave me most of her history.  X ray repeated here showed slightly displaced left humeral neck Fx.  Her head CT was negative, and serology was unremarkable.  Her CXR showed  mild congestion.  Orthopedics were consulted and will see her in the am, with plan to do surgery early tomorrow morning.  Hospitalist was asked to admit her for hip Fx's pathway Type of Study: Bedside swallow evaluation Previous Swallow Assessment: None found Diet Prior to this Study: Thin liquids Temperature Spikes Noted: No Respiratory Status: Nasal cannula History of Recent Intubation: Yes Length of Intubations (days): 1 days Date extubated: 04/17/13 Behavior/Cognition: Confused;Distractible;Requires cueing;Doesn't follow directions;Decreased sustained attention Oral Cavity - Dentition: Adequate natural dentition Self-Feeding Abilities: Needs assist Patient Positioning: Upright in chair Baseline Vocal Quality: Hoarse;Low vocal intensity Volitional Cough: Weak Volitional Swallow: Able to elicit    Oral/Motor/Sensory Function Overall Oral Motor/Sensory Function: Appears within functional limits for tasks assessed   Ice Chips Ice chips: Not tested   Thin Liquid Thin Liquid: Not tested    Nectar Thick Nectar Thick Liquid: Impaired Presentation: Spoon Oral Phase Impairments: Impaired anterior to posterior transit Oral phase functional implications: Prolonged oral transit Pharyngeal Phase Impairments: Suspected delayed Swallow;Decreased hyoid-laryngeal movement;Multiple swallows;Cough - Immediate   Honey Thick Honey Thick Liquid: Impaired Presentation: Spoon;Cup Pharyngeal Phase Impairments: Suspected delayed Swallow;Decreased hyoid-laryngeal movement;Multiple swallows;Throat Clearing - Immediate   Puree Puree: Impaired Presentation: Spoon Oral Phase Impairments: Impaired anterior to posterior transit Oral Phase Functional Implications: Prolonged oral transit Pharyngeal Phase Impairments: Suspected delayed Swallow;Decreased hyoid-laryngeal movement;Multiple swallows   Solid   GO    Solid: Impaired Oral Phase Impairments: Reduced lingual movement/coordination;Impaired anterior to  posterior transit;Poor awareness of bolus (Held piece of cracker in mouth and would not chew.) Oral Phase Functional Implications: Oral holding (SLP removed cracker, unchewed)       Roshaun Pound T 04/20/2013,2:51 PM

## 2013-04-20 NOTE — Progress Notes (Signed)
SPEECH PATHOLOGY  Attempted to see pt. For swallow evaluation.  Pt. Is sleeping soundly and currently will not awaken sufficiently for po's.  Discussed with RN, who reports pt. Has been lethargic since surgery.  RN also reports pt. Took meds in applesauce well, but choked with jello.  Talked with PT who will get pt. Up to the chair and SLP will reattempt.

## 2013-04-20 NOTE — Progress Notes (Signed)
Patient ID: Peggy Pratt, female   DOB: 01-18-1921, 79 y.o.   MRN: [id number] Subjective: 3 Days Post-Op Procedure(s) (LRB): ARTHROPLASTY BIPOLAR HIP (Left)    Patient reports pain as mild.  Comfortable, sleeping well this am  Objective:   VITALS:   Filed Vitals:   04/20/13 0656  BP: 138/77  Pulse: 79  Temp: 98.4 F (36.9 C)  Resp: 14    Neurovascular intact Incision: dressing C/D/I  LABS  Recent Labs  04/17/13 0740 04/18/13 0531 04/19/13 0555  HGB 11.4* 9.5* 9.1*  HCT 34.9* 30.0* 27.9*  WBC 8.5 6.4 7.2  PLT 169 139* 149*     Recent Labs  04/18/13 0531 04/19/13 0555 04/20/13 0500  NA 140 137 140  K 3.8 3.5 3.7  BUN 16 14 13   CREATININE 0.64 0.55 0.63  GLUCOSE 138* 144* 107*    No results found for this basename: LABPT, INR,  in the last 72 hours   Assessment/Plan: 3 Days Post-Op Procedure(s) (LRB): ARTHROPLASTY BIPOLAR HIP (Left)   Discharge to SNF when stable medically and bed available RTC in 2 weeks for wound check and fracture eval

## 2013-04-20 NOTE — Progress Notes (Signed)
Patient ID: Peggy Pratt  female  ZOX:[id number]    DOB: Jul 01, 1921    DOA: 04/16/2013  PCP: Ailene Ravel, MD  Assessment/Plan:  1. L femur neck fracture -Status post left hip hemiarthroplasty per Dr.Olin on 10/14  - DVT proph post op hip-per orthopedics  - hemoglobin remained stable at 9.1    2.Hypoxia with fluid overload - I/O's + 3.6 L, chest x-ray shows cardiomegaly with bibasilar atelectasis vs consolidation, edema or infection.  -Patient was given 20 mg IV Lasix yesterday, still has bibasilar crackles, Placed on IV Lasix 40 mg x1, BNP elevated 2931. Oral Lasix tomorrow.   3. Advanced Dementia  -stable, continue namenda  -xanax PRN   4. Recent L humerus and wrist Fx   5. Frequent falls  -likely from progressive dementia   6. Aspiration/ coughing - Obtain swallow evaluation to rule out any aspiration, will place on dysphagia 2 diet until swallow evaluation recommendations.  DVT Prophylaxis:  Code Status:DO NOT RESUSCITATE  Disposition:    Subjective: Somewhat confused, bibasal crackles, bleeding somewhat improving after Lasix yesterday.  Objective: Weight change:   Intake/Output Summary (Last 24 hours) at 04/20/13 1207 Last data filed at 04/20/13 0900  Gross per 24 hour  Intake    400 ml  Output      0 ml  Net    400 ml   Blood pressure 138/77, pulse 79, temperature 98.4 F (36.9 C), temperature source Oral, resp. rate 14, height 5' (1.524 m), SpO2 95.00%.  Physical Exam: General: Alert and awake NAD CVS: S1-S2 clear, no murmur rubs or gallops Chest:  bibasal crackles  Abdomen: soft nontender, nondistended, normal bowel sounds  Extremities: no cyanosis, clubbing or edema noted bilaterally   Lab Results: Basic Metabolic Panel:  Recent Labs Lab 04/19/13 0555 04/20/13 0500  NA 137 140  K 3.5 3.7  CL 105 104  CO2 26 30  GLUCOSE 144* 107*  BUN 14 13  CREATININE 0.55 0.63  CALCIUM 8.1* 8.5   Liver Function Tests:  Recent Labs Lab  04/16/13 2007  AST 26  ALT 16  ALKPHOS 88  BILITOT 0.5  PROT 6.8  ALBUMIN 3.0*   No results found for this basename: LIPASE, AMYLASE,  in the last 168 hours No results found for this basename: AMMONIA,  in the last 168 hours CBC:  Recent Labs Lab 04/18/13 0531 04/19/13 0555  WBC 6.4 7.2  HGB 9.5* 9.1*  HCT 30.0* 27.9*  MCV 90.6 90.6  PLT 139* 149*   Cardiac Enzymes: No results found for this basename: CKTOTAL, CKMB, CKMBINDEX, TROPONINI,  in the last 168 hours BNP: No components found with this basename: POCBNP,  CBG: No results found for this basename: GLUCAP,  in the last 168 hours   Micro Results: Recent Results (from the past 240 hour(s))  SURGICAL PCR SCREEN     Status: None   Collection Time    04/17/13  2:18 PM      Result Value Range Status   MRSA, PCR NEGATIVE  NEGATIVE Final   Staphylococcus aureus NEGATIVE  NEGATIVE Final   Comment:            The Xpert SA Assay (FDA     approved for NASAL specimens     in patients over 48 years of age),     is one component of     a comprehensive surveillance     program.  Test performance has     been validated by [employer]  [facility] for patients greater     than or equal to 58 year old.     It is not intended     to diagnose infection nor to     guide or monitor treatment.    Studies/Results: Dg Chest 1 View  04/16/2013   CLINICAL DATA:  Fall with right-sided rib pain.  EXAM: CHEST - 1 VIEW  COMPARISON:  02/02/2013  FINDINGS: The heart is enlarged. There is moderate vascular congestion without overt CHF. The bones are demineralized. Chronic left humeral neck fracture. An acute rib fracture on the right is not visible. There is no pneumothorax or effusion.  IMPRESSION: Stable cardiomegaly and osseous findings. No acute rib fracture. Moderate vascular congestion.   Electronically Signed   By: Davonna Belling M.D.   On: 04/16/2013 20:13   Dg Hip Complete Left  04/16/2013   CLINICAL DATA:  Fall. Left hip injury and  pain.  EXAM: LEFT HIP - COMPLETE 2+ VIEW  COMPARISON:  None.  FINDINGS: A mildly displaced left femoral neck fracture is seen. No evidence of dislocation. No pelvic fracture identified. Large calcified uterine fibroid noted in the central pelvis.  IMPRESSION: Mildly displaced left femoral neck fracture. Osteopenia.   Electronically Signed   By: Myles Rosenthal M.D.   On: 04/16/2013 20:09   Ct Head Wo Contrast  04/16/2013   CLINICAL DATA:  Decreased level of consciousness. Alzheimer's disease.  EXAM: CT HEAD WITHOUT CONTRAST  TECHNIQUE: Contiguous axial images were obtained from the base of the skull through the vertex without contrast.  COMPARISON:  MR brain 04/03/2010.  FINDINGS: No evidence for acute infarction, hemorrhage, mass lesion, hydrocephalus, or extra-axial fluid. Moderate atrophy. Chronic microvascular ischemic change. Vascular calcification. Calvarium intact. Clear sinuses and mastoids.  IMPRESSION: No acute intracranial abnormality. Atrophy and small vessel disease. Similar appearance to priors.   Electronically Signed   By: Davonna Belling M.D.   On: 04/16/2013 20:25   Dg Pelvis Portable  04/17/2013   CLINICAL DATA:  Status post left hip replacement.  EXAM: PORTABLE PELVIS  COMPARISON:  Single view of the pelvis 11/22/2010.  FINDINGS: The patient has a new bipolar left hip hemiarthroplasty. The device is located. There is no fracture. Gas the soft tissues from surgery is noted. Calcified uterine fibroid also seen.  IMPRESSION: Left hip replacement without acute abnormality.   Electronically Signed   By: Drusilla Kanner M.D.   On: 04/17/2013 18:18   Dg Chest Port 1 View  04/18/2013   CLINICAL DATA:  Hypoxia question infiltrates, weakness, shortness of breath, history Alzheimer's  EXAM: PORTABLE CHEST - 1 VIEW  COMPARISON:  Portable exam 2004 hr compared to 04/16/2013  FINDINGS: Enlargement of cardiac silhouette with pulmonary vascular congestion.  Atherosclerotic calcification aorta.  Scattered  interstitial infiltrates question edema versus infection.  More focal consolidation over atelectasis present at bases greater on left.  No pneumothorax or definite pleural effusion.  Significant osseous demineralization with posttraumatic deformity of the proximal left humerus.  IMPRESSION: Enlargement of cardiac silhouette.  Bibasilar atelectasis versus consolidation with suspect bilateral diffuse infiltrates which could represent edema or infection.   Electronically Signed   By: Ulyses Southward M.D.   On: 04/18/2013 22:00    Medications: Scheduled Meds: . albuterol  2.5 mg Nebulization TID  . aspirin EC  325 mg Oral BID AC  . cholecalciferol  1,000 Units Oral Daily  . citalopram  20 mg Oral Daily  . docusate sodium  100 mg Oral BID  .  feeding supplement (ENSURE COMPLETE)  237 mL Oral BID BM  . memantine  10 mg Oral BID  . multivitamin with minerals  1 tablet Oral Daily      LOS: 4 days   RAI,RIPUDEEP M.D. [facility] 04/20/2013, 12:07 PM Pager: [phone]  If 7PM-7AM, please contact night-coverage [email or web address] Password [email or web address]

## 2013-04-20 NOTE — Progress Notes (Signed)
Physical Therapy Treatment Patient Details Name: JAMILEX BOHNSACK MRN: [id number] DOB: [birth year] Today's Date: 04/20/2013 Time: 0865-7846 PT Time Calculation (min): 17 min  PT Assessment / Plan / Recommendation  History of Present Illness The patient and 79 year old female who presented to the emergency room earlier this morning after a fall she sustained at home. She has a history of progressive dementia and does have full-time care at home as she is unable to perform activities of daily living. Earlier this morning she tripped and fell noting pain and difficulties with the left upper extremity. She was brought to the emergency room for initial evaluation. She was found to have a highly comminuted displaced distal radius fracture  and proximal humerus fx about the left upper extremity.   PT Comments     Follow Up Recommendations  SNF     Does the patient have the potential to tolerate intense rehabilitation     Barriers to Discharge        Equipment Recommendations  None recommended by PT    Recommendations for Other Services OT consult  Frequency Min 3X/week   Progress towards PT Goals Progress towards PT goals: Progressing toward goals  Plan Current plan remains appropriate    Precautions / Restrictions Precautions Precautions: Posterior Hip;Fall Restrictions Weight Bearing Restrictions: No Other Position/Activity Restrictions: WBAT       Mobility  Bed Mobility Bed Mobility: Sit to Supine Supine to Sit: 1: +2 Total assist Supine to Sit: Patient Percentage: 0% Sit to Supine: 1: +2 Total assist Sit to Supine: Patient Percentage: 0% Details for Bed Mobility Assistance: utilized pad to bring pt to EOB and sit up Transfers Transfers: Sit to Stand;Stand to Sit Sit to Stand: 1: +2 Total assist Sit to Stand: Patient Percentage: 40% Stand to Sit: 1: +2 Total assist Stand to Sit: Patient Percentage: 40% Details for Transfer Assistance: pt following min VC but follows and  assists when movement physically initiated Ambulation/Gait Ambulation/Gait Assistance: 1: +2 Total assist Ambulation/Gait: Patient Percentage: 50% Ambulation Distance (Feet): 5 Feet Assistive device: Rolling walker Ambulation/Gait Assistance Details: assist for balance support and RW management Gait Pattern: Step-to pattern;Decreased step length - right;Decreased step length - left;Shuffle;Trunk flexed Stairs: No    Exercises Total Joint Exercises Ankle Circles/Pumps: AAROM;10 reps;Supine;Both Heel Slides: AAROM;15 reps;Left;Supine Hip ABduction/ADduction: AAROM;Left;15 reps;Supine   PT Diagnosis:    PT Problem List:   PT Treatment Interventions:     PT Goals (current goals can now be found in the care plan section) Acute Rehab PT Goals Patient Stated Goal: Pt unable to state PT Goal Formulation: Patient unable to participate in goal setting Time For Goal Achievement: 04/25/13 Potential to Achieve Goals: Fair  Visit Information  Last PT Received On: 04/20/13 Assistance Needed: +2 History of Present Illness: The patient and 79 year old female who presented to the emergency room earlier this morning after a fall she sustained at home. She has a history of progressive dementia and does have full-time care at home as she is unable to perform activities of daily living. Earlier this morning she tripped and fell noting pain and difficulties with the left upper extremity. She was brought to the emergency room for initial evaluation. She was found to have a highly comminuted displaced distal radius fracture  and proximal humerus fx about the left upper extremity.    Subjective Data  Patient Stated Goal: Pt unable to state   Cognition  Cognition Arousal/Alertness: Lethargic Behavior During Therapy: [facility] for tasks assessed/performed Overall Cognitive Status:  History of cognitive impairments - at baseline    Balance  Static Sitting Balance Static Sitting - Balance Support: Bilateral  upper extremity supported Static Sitting - Level of Assistance: 4: Min assist;5: Stand by assistance Static Sitting - Comment/# of Minutes: 5  End of Session PT - End of Session Equipment Utilized During Treatment: Gait belt Activity Tolerance: Patient limited by fatigue;Patient limited by lethargy Patient left: in bed;with call bell/phone within reach Nurse Communication: Mobility status   GP     Airis Barbee 04/20/2013, 4:50 PM

## 2013-04-21 LAB — CBC
MCH: 29.1 pg (ref 26.0–34.0)
MCHC: 32.7 g/dL (ref 30.0–36.0)
MCV: 89.1 fL (ref 78.0–100.0)
Platelets: 231 10*3/uL (ref 150–400)
RDW: 14.3 % (ref 11.5–15.5)

## 2013-04-21 LAB — BASIC METABOLIC PANEL
BUN: 19 mg/dL (ref 6–23)
Calcium: 8.7 mg/dL (ref 8.4–10.5)
Creatinine, Ser: 0.66 mg/dL (ref 0.50–1.10)
GFR calc Af Amer: 87 mL/min — ABNORMAL LOW (ref >90)
GFR calc non Af Amer: 75 mL/min — ABNORMAL LOW (ref >90)
Glucose, Bld: 118 mg/dL — ABNORMAL HIGH (ref 70–99)
Potassium: 3.3 mEq/L — ABNORMAL LOW (ref 3.5–5.1)

## 2013-04-21 MED ORDER — POTASSIUM CHLORIDE CRYS ER 20 MEQ PO TBCR
60.0000 meq | EXTENDED_RELEASE_TABLET | Freq: Once | ORAL | Status: AC
  Administered 2013-04-21: 12:00:00 60 meq via ORAL
  Filled 2013-04-21: qty 3

## 2013-04-21 NOTE — Progress Notes (Addendum)
[facility] PROGRESS NOTE  Peggy Pratt YQM:[id number] DOB: May 14, 1921 DOA: 04/16/2013 PCP: Ailene Ravel, MD  Assessment/Plan:  1. L femur neck fracture -Status post left hip hemiarthroplasty per Dr.Olin on 10/14 - DVT proph post op hip-per orthopedics -Appreciate orthopedic assistance - hemoglobin remained stable at 9.1  2.Hypoxia -Pt post op was requiring increased supplemental O2, was placed on when necessary nebs, incentive spirometry if patient able to do -Chest x-ray done 10/15 showed atelectasis vs edema versus infection, clinical impression was that it was Less likely infection given that she is afebrile with no leukocytosis.   IV fluids was dc'ed and pt duiresed as needed with Lasix, andO2 wean down. -hypoxia now resloved and pt satting well on room air  3. Advanced Dementia -stable, continue namenda  -xanax PRN  4. Recent L humerus and wrist Fx  5. Frequent falls -likely from progressive dementia  6.Dysphagia -continue D1 diet as recommended per ST -Her PO intake with the D1 is minimal to none and I discussed this with her Son and the wife and they do not want TF as per pt's wishes.  -also discussed palliative care and they would like to give pt another day to see if her po intake improves before considering palliative involvement.   Code Status: DNR Family Communication: none at bedside Disposition Plan:likely SNF   Consultants:  Ortho Dr.Olin  HPI/Subjective: -pt sitting up in chair, still confused, minimal to no po intake per nss staff  Objective: Filed Vitals:   04/21/13 0652  BP:   Pulse: 76  Temp: 98 F (36.7 C)  Resp: 18    Intake/Output Summary (Last 24 hours) at 04/21/13 1326 Last data filed at 04/21/13 1302  Gross per 24 hour  Intake    205 ml  Output      0 ml  Net    205 ml   There were no vitals filed for this visit.  Exam:   General:  Awake, disoriented in NAD.  Cardiovascular: S1S2/RRR  Respiratory: diminished  at bases, no crackles, no wheezes  Abdomen: soft, NT  Musculoskeletal: No cyanosis and no edema  Data Reviewed: Basic Metabolic Panel:  Recent Labs Lab 04/16/13 2007 04/17/13 0740 04/18/13 0531 04/19/13 0555 04/20/13 0500 04/21/13 0445  NA 138  --  140 137 140 140  K 4.0  --  3.8 3.5 3.7 3.3*  CL 102  --  105 105 104 101  CO2 28  --  28 26 30  32  GLUCOSE 143*  --  138* 144* 107* 118*  BUN 26*  --  16 14 13 19   CREATININE 0.85 0.67 0.64 0.55 0.63 0.66  CALCIUM 8.9  --  8.1* 8.1* 8.5 8.7   Liver Function Tests:  Recent Labs Lab 04/16/13 2007  AST 26  ALT 16  ALKPHOS 88  BILITOT 0.5  PROT 6.8  ALBUMIN 3.0*   No results found for this basename: LIPASE, AMYLASE,  in the last 168 hours No results found for this basename: AMMONIA,  in the last 168 hours CBC:  Recent Labs Lab 04/16/13 2007 04/17/13 0740 04/18/13 0531 04/19/13 0555 04/21/13 0445  WBC 10.3 8.5 6.4 7.2 10.7*  HGB 12.0 11.4* 9.5* 9.1* 10.2*  HCT 36.0 34.9* 30.0* 27.9* 31.2*  MCV 89.6 89.7 90.6 90.6 89.1  PLT 176 169 139* 149* 231   Cardiac Enzymes: No results found for this basename: CKTOTAL, CKMB, CKMBINDEX, TROPONINI,  in the last 168 hours BNP (last 3 results)  Recent Labs  04/20/13 0500  PROBNP 2931.0*   CBG: No results found for this basename: GLUCAP,  in the last 168 hours  Recent Results (from the past 240 hour(s))  SURGICAL PCR SCREEN     Status: None   Collection Time    04/17/13  2:18 PM      Result Value Range Status   MRSA, PCR NEGATIVE  NEGATIVE Final   Staphylococcus aureus NEGATIVE  NEGATIVE Final   Comment:            The Xpert SA Assay (FDA     approved for NASAL specimens     in patients over 70 years of age),     is one component of     a comprehensive surveillance     program.  Test performance has     been validated by [employer] for patients greater     than or equal to 76 year old.     It is not intended     to diagnose infection nor to     guide or  monitor treatment.     Studies: No results found.  Scheduled Meds: . albuterol  2.5 mg Nebulization TID  . aspirin EC  325 mg Oral BID AC  . cholecalciferol  1,000 Units Oral Daily  . citalopram  20 mg Oral Daily  . docusate sodium  100 mg Oral BID  . feeding supplement (ENSURE COMPLETE)  237 mL Oral BID BM  . furosemide  40 mg Oral Daily  . memantine  10 mg Oral BID  . multivitamin with minerals  1 tablet Oral Daily   Continuous Infusions:    Principal Problem:   Fracture of left hip requiring operative repair Active Problems:   Alzheimer's dementia   Recurrent falls   Heart murmur   Thrombocytopenia, unspecified   DNR (do not resuscitate)    Time spent:    [facility]  [facility] Pager [phone]. If 7PM-7AM, please contact night-coverage at [email or web address], password [facility] 04/21/2013, 1:26 PM  LOS: 5 days

## 2013-04-21 NOTE — Plan of Care (Signed)
Problem: Phase III Progression Outcomes Goal: Ambulate BID with assist as able Outcome: Not Applicable Date Met:  04/21/13 Up in chair

## 2013-04-22 LAB — CBC
Hemoglobin: 10 g/dL — ABNORMAL LOW (ref 12.0–15.0)
MCHC: 32.3 g/dL (ref 30.0–36.0)
Platelets: 287 10*3/uL (ref 150–400)
RBC: 3.47 MIL/uL — ABNORMAL LOW (ref 3.87–5.11)

## 2013-04-22 LAB — BASIC METABOLIC PANEL
Creatinine, Ser: 0.7 mg/dL (ref 0.50–1.10)
GFR calc Af Amer: 86 mL/min — ABNORMAL LOW (ref >90)
GFR calc non Af Amer: 74 mL/min — ABNORMAL LOW (ref >90)
Potassium: 4.3 mEq/L (ref 3.5–5.1)
Sodium: 141 mEq/L (ref 135–145)

## 2013-04-22 NOTE — Progress Notes (Signed)
Per MD, Pt not medically stable for d/c.  Weekday CSW to follow.  Providence Crosby, LCSWA Clinical Social Work [phone]

## 2013-04-22 NOTE — Progress Notes (Addendum)
[facility] PROGRESS NOTE  Peggy Pratt ZOX:[id number] DOB: 1920/10/26 DOA: 04/16/2013 PCP: Ailene Ravel, MD  Assessment/Plan:  1. L femur neck fracture -Status post left hip hemiarthroplasty per Dr.Olin on 10/14 - DVT proph post op hip-per orthopedics -Appreciate orthopedic assistance - hemoglobin remained stable at 9.1  2.Hypoxia -Pt post op was requiring increased supplemental O2, was placed on when necessary nebs, incentive spirometry if patient able to do -Chest x-ray done 10/15 showed atelectasis vs edema versus infection, clinical impression was that it was Less likely infection given that she is afebrile with no leukocytosis.   IV fluids was dc'ed and pt duiresed as needed with Lasix, andO2 wean down. -hypoxia now resloved and pt satting well on room air  3. Advanced Dementia -stable, continue namenda  -xanax PRN  4. Recent L humerus and wrist Fx  5. Frequent falls -likely from progressive dementia  6.Dysphagia -continue D1 diet as recommended per ST -Her PO intake with the D1 is minimal to none and I discussed this with her Son and the wife 10/18 and they do not want TF as per pt's wishes.  -also discussed palliative care 10/18 and they would like to give pt another day to see if her po intake improves before considering palliative involvement.  -pt's PO intake still minimal will discuss palliative care with family in am 10/20  Code Status: DNR Family Communication: none at bedside Disposition Plan:likely SNF   Consultants:  Ortho Dr.Olin  HPI/Subjective: -pt sitting up in chair, still with minimal to no po intake per nsg staff  Objective: Filed Vitals:   04/22/13 1352  BP: 145/85  Pulse: 93  Temp: 98.1 F (36.7 C)  Resp: 16    Intake/Output Summary (Last 24 hours) at 04/22/13 1900 Last data filed at 04/22/13 1716  Gross per 24 hour  Intake    313 ml  Output      0 ml  Net    313 ml   There were no vitals filed for this  visit.  Exam:   General:  Awake, disoriented in NAD.  Cardiovascular: S1S2/RRR  Respiratory: diminished at bases, no crackles, no wheezes  Abdomen: soft, NT  Musculoskeletal: No cyanosis and no edema  Data Reviewed: Basic Metabolic Panel:  Recent Labs Lab 04/18/13 0531 04/19/13 0555 04/20/13 0500 04/21/13 0445 04/22/13 0400  NA 140 137 140 140 141  K 3.8 3.5 3.7 3.3* 4.3  CL 105 105 104 101 103  CO2 28 26 30  32 30  GLUCOSE 138* 144* 107* 118* 116*  BUN 16 14 13 19 23   CREATININE 0.64 0.55 0.63 0.66 0.70  CALCIUM 8.1* 8.1* 8.5 8.7 9.3   Liver Function Tests:  Recent Labs Lab 04/16/13 2007  AST 26  ALT 16  ALKPHOS 88  BILITOT 0.5  PROT 6.8  ALBUMIN 3.0*   No results found for this basename: LIPASE, AMYLASE,  in the last 168 hours No results found for this basename: AMMONIA,  in the last 168 hours CBC:  Recent Labs Lab 04/17/13 0740 04/18/13 0531 04/19/13 0555 04/21/13 0445 04/22/13 0400  WBC 8.5 6.4 7.2 10.7* 9.7  HGB 11.4* 9.5* 9.1* 10.2* 10.0*  HCT 34.9* 30.0* 27.9* 31.2* 31.0*  MCV 89.7 90.6 90.6 89.1 89.3  PLT 169 139* 149* 231 287   Cardiac Enzymes: No results found for this basename: CKTOTAL, CKMB, CKMBINDEX, TROPONINI,  in the last 168 hours BNP (last 3 results)  Recent Labs  04/20/13 0500  PROBNP 2931.0*   CBG:  No results found for this basename: GLUCAP,  in the last 168 hours  Recent Results (from the past 240 hour(s))  SURGICAL PCR SCREEN     Status: None   Collection Time    04/17/13  2:18 PM      Result Value Range Status   MRSA, PCR NEGATIVE  NEGATIVE Final   Staphylococcus aureus NEGATIVE  NEGATIVE Final   Comment:            The Xpert SA Assay (FDA     approved for NASAL specimens     in patients over 75 years of age),     is one component of     a comprehensive surveillance     program.  Test performance has     been validated by [employer] for patients greater     than or equal to 29 year old.     It is  not intended     to diagnose infection nor to     guide or monitor treatment.     Studies: No results found.  Scheduled Meds: . albuterol  2.5 mg Nebulization TID  . aspirin EC  325 mg Oral BID AC  . cholecalciferol  1,000 Units Oral Daily  . citalopram  20 mg Oral Daily  . docusate sodium  100 mg Oral BID  . feeding supplement (ENSURE COMPLETE)  237 mL Oral BID BM  . furosemide  40 mg Oral Daily  . memantine  10 mg Oral BID  . multivitamin with minerals  1 tablet Oral Daily   Continuous Infusions:    Principal Problem:   Fracture of left hip requiring operative repair Active Problems:   Alzheimer's dementia   Recurrent falls   Heart murmur   Thrombocytopenia, unspecified   DNR (do not resuscitate)    Time spent:    [facility]  [facility] Pager [phone]. If 7PM-7AM, please contact night-coverage at [email or web address], password [facility] 04/22/2013, 7:00 PM  LOS: 6 days

## 2013-04-22 NOTE — Progress Notes (Signed)
Physical Therapy Treatment Patient Details Name: Peggy Pratt MRN: [id number] DOB: 10-03-1920 Today's Date: 04/22/2013 Time: 4098-1191 PT Time Calculation (min): 16 min  PT Assessment / Plan / Recommendation  History of Present Illness     PT Comments   Assisted pt out of recliner to [facility] then back to bed.  Pt still requires + 2 assist.   Follow Up Recommendations  SNF     Does the patient have the potential to tolerate intense rehabilitation     Barriers to Discharge        Equipment Recommendations       Recommendations for Other Services    Frequency Min 3X/week   Progress towards PT Goals Progress towards PT goals: Progressing toward goals  Plan      Precautions / Restrictions Precautions Precautions: Posterior Hip;Fall Precaution Comments: Hx dementia, requires Honey Thicken Liquids Restrictions Weight Bearing Restrictions: No Other Position/Activity Restrictions: WBAT    Pertinent Vitals/Pain No c/o pain    Mobility  Bed Mobility Bed Mobility: Sit to Supine Supine to Sit: 1: +2 Total assist Supine to Sit: Patient Percentage: 10% Sit to Supine: 1: +2 Total assist Sit to Supine: Patient Percentage: 10% Details for Bed Mobility Assistance: assisted back to bed Transfers Transfers: Sit to Stand;Stand to Sit Sit to Stand: 1: +2 Total assist;From chair/3-in-1;From toilet Sit to Stand: Patient Percentage: 50% Stand to Sit: 1: +2 Total assist;To toilet;To bed Stand to Sit: Patient Percentage: 50% Details for Transfer Assistance: assisted from recliner to [facility] to void then a few backward steps to bed Ambulation/Gait Ambulation/Gait Assistance: 1: +2 Total assist Ambulation/Gait: Patient Percentage: 60% Ambulation Distance (Feet): 2 Feet Assistive device: Rolling walker Ambulation/Gait Assistance Details: backward gait to bed + 2 assist and 75% VC's for direction Gait Pattern: Step-to pattern;Decreased step length - right;Decreased step length -  left;Shuffle;Trunk flexed Gait velocity: decreased     PT Goals (current goals can now be found in the care plan section)    Visit Information  Last PT Received On: 04/22/13 Assistance Needed: +2    Subjective Data      Cognition       Balance     End of Session PT - End of Session Equipment Utilized During Treatment: Gait belt Activity Tolerance: Patient tolerated treatment well Patient left: in bed;with call bell/phone within reach   Felecia Shelling  PTA [facility]  Acute  Rehab Pager      [phone]

## 2013-04-22 NOTE — Progress Notes (Signed)
Physical Therapy Treatment Patient Details Name: Peggy Pratt MRN: [id number] DOB: 19-Oct-1920 Today's Date: 04/22/2013 Time: 4098-1191 PT Time Calculation (min): 32 min  PT Assessment / Plan / Recommendation  History of Present Illness     PT Comments   POD # 5 L hemiarthroplasty.  Pt pleasantly confused.  Assisted OOB to [facility] to see is she would void.  Assisted with hygiene.  Amb in hallway. Positioned with pillows for comfort.    Follow Up Recommendations  SNF     Does the patient have the potential to tolerate intense rehabilitation     Barriers to Discharge        Equipment Recommendations       Recommendations for Other Services    Frequency Min 3X/week   Progress towards PT Goals Progress towards PT goals: Progressing toward goals  Plan      Precautions / Restrictions Precautions Precautions: Posterior Hip;Fall Precaution Comments: Hx dementia, requires Honey Thicken Liquids Restrictions Weight Bearing Restrictions: No Other Position/Activity Restrictions: WBAT    Pertinent Vitals/Pain No c/o pain    Mobility  Bed Mobility Bed Mobility: Supine to Sit Supine to Sit: 1: +2 Total assist Supine to Sit: Patient Percentage: 10% Details for Bed Mobility Assistance: utilized pad to bring pt to EOB and sit up Transfers Transfers: Sit to Stand;Stand to Sit Sit to Stand: 1: +2 Total assist Sit to Stand: Patient Percentage: 50% Stand to Sit: 1: +2 Total assist Stand to Sit: Patient Percentage: 50% Details for Transfer Assistance: pt following min VC but follows and assists when movement physically initiated Ambulation/Gait Ambulation/Gait Assistance: 1: +2 Total assist Ambulation/Gait: Patient Percentage: 60% Ambulation Distance (Feet): 42 Feet Assistive device: Rolling walker Ambulation/Gait Assistance Details: with assistance to advance RW pt did amb in hallway with second assist following with recliner. Gait Pattern: Step-to pattern;Decreased step length -  right;Decreased step length - left;Shuffle;Trunk flexed Gait velocity: decreased    PT Goals (current goals can now be found in the care plan section)    Visit Information  Last PT Received On: 04/22/13 Assistance Needed: +2    Subjective Data      Cognition       Balance     End of Session PT - End of Session Equipment Utilized During Treatment: Gait belt Activity Tolerance: Patient tolerated treatment well   Felecia Shelling  PTA [facility]  Acute  Rehab Pager      [phone]

## 2013-04-23 LAB — BASIC METABOLIC PANEL
BUN: 24 mg/dL — ABNORMAL HIGH (ref 6–23)
CO2: 27 mEq/L (ref 19–32)
Chloride: 100 mEq/L (ref 96–112)
GFR calc non Af Amer: 74 mL/min — ABNORMAL LOW (ref >90)
Glucose, Bld: 106 mg/dL — ABNORMAL HIGH (ref 70–99)
Potassium: 4.7 mEq/L (ref 3.5–5.1)
Sodium: 137 mEq/L (ref 135–145)

## 2013-04-23 LAB — CBC
HCT: 33.2 % — ABNORMAL LOW (ref 36.0–46.0)
Hemoglobin: 10.6 g/dL — ABNORMAL LOW (ref 12.0–15.0)
MCH: 28.7 pg (ref 26.0–34.0)
MCV: 90 fL (ref 78.0–100.0)
Platelets: 296 10*3/uL (ref 150–400)
RBC: 3.69 MIL/uL — ABNORMAL LOW (ref 3.87–5.11)
WBC: 9.8 10*3/uL (ref 4.0–10.5)

## 2013-04-23 MED ORDER — ENSURE COMPLETE PO LIQD: 237.0000 mL | Freq: Two times a day (BID) | ORAL | Status: DC

## 2013-04-23 MED ORDER — FUROSEMIDE 20 MG PO TABS: 20.0000 mg | ORAL_TABLET | Freq: Every day | ORAL | Status: DC | PRN

## 2013-04-23 MED ORDER — DSS 100 MG PO CAPS: 100.0000 mg | ORAL_CAPSULE | Freq: Two times a day (BID) | ORAL | Status: AC

## 2013-04-23 MED ORDER — ALBUTEROL SULFATE (5 MG/ML) 0.5% IN NEBU: 2.5000 mg | INHALATION_SOLUTION | Freq: Two times a day (BID) | RESPIRATORY_TRACT | Status: DC

## 2013-04-23 MED ORDER — ALPRAZOLAM 0.25 MG PO TABS: 0.2500 mg | ORAL_TABLET | Freq: Three times a day (TID) | ORAL | Status: DC | PRN

## 2013-04-23 MED ORDER — RESOURCE THICKENUP CLEAR PO POWD: ORAL | Status: DC

## 2013-04-23 NOTE — Discharge Summary (Signed)
Physician Discharge Summary  [name]:[id number] DOB: 29-Oct-1920 DOA: 04/16/2013  PCP: Ailene Ravel, MD  Admit date: 04/16/2013 Discharge date: 04/23/2013  Time spent: >30 minutes  Recommendations for Outpatient Follow-up:  Follow-up Information   Follow up with Shelda Pal, MD. Schedule an appointment as soon as possible for a visit in 2 weeks.   Specialty:  Orthopedic Surgery   Contact information:   [address] [place] [address] [phone]       Please follow up. (SNF MD in 1-2days)        Discharge Diagnoses:  Principal Problem:   Fracture of left hip requiring operative repair Active Problems:   Alzheimer's dementia   Recurrent falls   Heart murmur   Thrombocytopenia, unspecified   DNR (do not resuscitate)   Discharge Condition: improved/Stable  Diet recommendation: Dyspagia 1 diet with nectar thick liquids  There were no vitals filed for this visit.  History of present illness:  Peggy Pratt is a 79 y.o. female with hx of advanced dementia, osteoporosis, recents falls, SNF resident with DNR code statuts, recent wrist and shouldder Fx, fell unwitnessed at the [facility] Tuesday prior to this admission, brought to the ER as hip Xray finally showed displaced Fx of the left hip. She had 2 prior xrays which didn't show any Fx, but this last one did. She was given Fentanyl via EMS, and now is lethargic and unable converse. Her daughter in law (a [facility]) gave admitting M.D. most of her history. X ray repeated here showed slightly displaced left humeral neck Fx. Her head CT was negative, and serology was unremarkable. Her CXR showed mild congestion. Orthopedics were consulted and will see her in the am, with plan to do surgery. Pt  was admitted to [facility] for further evaluation and management.  Hospital Course:  1. L femur neck fracture  -As discussed above, upon admission orthopedics was consulted and she was taken for left hip  hemiarthroplasty per Dr.Olin on 10/14  - She was placed on aspirin 325 twice a day for DVT proph post op hip-per orthopedics  - hemoglobin remained stable at 9.1  -Orthopedics followed up with patient and from their standpoint she is stable for discharge to rehabilitation and is to follow up outpatient in 2 weeks. 2.Hypoxia  -Pt post op was requiring increased supplemental O2, was placed on when necessary nebs, incentive spirometry if patient able to do  -Chest x-ray done 10/15 showed atelectasis vs edema versus infection, clinical impression was that it was Less likely infection given that she is afebrile with no leukocytosis. IV fluids was dc'ed and pt duiresed as needed with Lasix, and O2 weaned off.  -hypoxia now resloved and pt satting well on room air  -Patient was that this was secondary to volume overload R., her BNP was also noted to be elevated. She has been adequately diuresed at this time, and discharged on a low dose of Lasix when necessary only as she has poor by mouth intake. Nursing home M.D. to continue to monitor fluid/volume status and further treat accordingly. 3. Advanced Dementia  -stable, continue namenda  -xanax PRN  4. Recent L humerus and wrist Fx  5. Frequent falls  -likely from progressive dementia  6.Dysphagia  -Pt Was evaluated by speech therapy and D1 diet nectar thick liquids recommended  -Her PO intake with the D1 is minimal to none a initially and I discussed this with her Son and the wife 10/18 and they do not  want TF as per pt's wishes.  -Today her by mouth intake has improved although per nursing staff at takes a long time for her to get her eat the amount she has improved to. -I discussed this with her daughter-in-law [employer] today and they came with patient she is eating as much as she can eat -Medical he stable for discharge at this time and to followup outpatient with nursing home M.D.    Procedures: Left hip  hemiarthroplasty  Consultations:  Orthopedics-Dr. Charlann Boxer  Discharge Exam: Filed Vitals:   04/23/13 0547  BP: 126/75  Pulse: 72  Temp: 97.9 F (36.6 C)  Resp: 16   Exam:  General: Awake, disoriented in NAD.  Cardiovascular: S1S2/RRR  Respiratory: diminished at bases, no crackles, no wheezes  Abdomen: soft, NT  Musculoskeletal: No cyanosis and no edema    Discharge Instructions  Discharge Orders   Future Appointments Provider Department Dept Phone   06/26/2013 3:30 PM Melvyn Novas, MD [facility] [phone]   Future Orders Complete By Expires   Call MD / Call 911  As directed    Comments:     If you experience chest pain or shortness of breath, CALL 911 and be transported to the hospital emergency room.  If you develope a fever above 101 F, pus (white drainage) or increased drainage or redness at the wound, or calf pain, call your surgeon's office.   Constipation Prevention  As directed    Comments:     Drink plenty of fluids.  Prune juice may be helpful.  You may use a stool softener, such as Colace (over the counter) 100 mg twice a day.  Use MiraLax (over the counter) for constipation as needed.   DIET - DYS 1  As directed    Diet - low sodium heart healthy  As directed    Discharge instructions  As directed    Comments:     Maintain surgical dressing for 10-14 days, then replace with gauze and tape. Keep the area dry and clean until follow up. Follow up in 2 weeks at [facility]. Call with any questions or concerns.   Increase activity slowly as tolerated  As directed    Increase activity slowly  As directed    Weight bearing as tolerated  As directed    Questions:     Laterality:     Extremity:         Medication List    STOP taking these medications       traMADol 50 MG tablet  Commonly known as:  ULTRAM      TAKE these medications       acetaminophen 325 MG tablet  Commonly known as:  TYLENOL  Take 325 mg by mouth  every 4 (four) hours as needed for pain.     ALPRAZolam 0.25 MG tablet  Commonly known as:  XANAX  Take 1 tablet (0.25 mg total) by mouth 3 (three) times daily as needed for sleep.     aspirin 325 MG EC tablet  Take 1 tablet (325 mg total) by mouth 2 (two) times daily before a meal.     cholecalciferol 1000 UNITS tablet  Commonly known as:  VITAMIN D  Take 1,000 Units by mouth daily.     citalopram 20 MG tablet  Commonly known as:  CELEXA  Take 20 mg by mouth daily.     DSS 100 MG Caps  Take 100 mg by mouth 2 (two) times daily.  feeding supplement (ENSURE COMPLETE) Liqd  Take 237 mLs by mouth 2 (two) times daily between meals.     furosemide 20 MG tablet  Commonly known as:  LASIX  Take 1 tablet (20 mg total) by mouth daily as needed for fluid or edema.     HYDROcodone-acetaminophen 5-325 MG per tablet  Commonly known as:  NORCO  Take 1-2 tablets by mouth every 6 (six) hours as needed for pain.     multivitamin with minerals tablet  Take 1 tablet by mouth daily.     NAMENDA XR 28 MG Cp24  Generic drug:  Memantine HCl ER  Take 1 capsule by mouth daily.     RESOURCE THICKENUP CLEAR Powd  ALL Liquids to be nectar thickened       Allergies  Allergen Reactions  . Penicillins     Unknown reaction       Follow-up Information   Follow up with Shelda Pal, MD. Schedule an appointment as soon as possible for a visit in 2 weeks.   Specialty:  Orthopedic Surgery   Contact information:   [address] [place] [address] [phone]       Please follow up. (SNF MD in 1-2days)        The results of significant diagnostics from this hospitalization (including imaging, microbiology, ancillary and laboratory) are listed below for reference.    Significant Diagnostic Studies: Dg Chest 1 View  04/16/2013   CLINICAL DATA:  Fall with right-sided rib pain.  EXAM: CHEST - 1 VIEW  COMPARISON:  02/02/2013  FINDINGS: The heart is enlarged. There is  moderate vascular congestion without overt CHF. The bones are demineralized. Chronic left humeral neck fracture. An acute rib fracture on the right is not visible. There is no pneumothorax or effusion.  IMPRESSION: Stable cardiomegaly and osseous findings. No acute rib fracture. Moderate vascular congestion.   Electronically Signed   By: Davonna Belling M.D.   On: 04/16/2013 20:13   Dg Hip Complete Left  04/16/2013   CLINICAL DATA:  Fall. Left hip injury and pain.  EXAM: LEFT HIP - COMPLETE 2+ VIEW  COMPARISON:  None.  FINDINGS: A mildly displaced left femoral neck fracture is seen. No evidence of dislocation. No pelvic fracture identified. Large calcified uterine fibroid noted in the central pelvis.  IMPRESSION: Mildly displaced left femoral neck fracture. Osteopenia.   Electronically Signed   By: Myles Rosenthal M.D.   On: 04/16/2013 20:09   Ct Head Wo Contrast  04/16/2013   CLINICAL DATA:  Decreased level of consciousness. Alzheimer's disease.  EXAM: CT HEAD WITHOUT CONTRAST  TECHNIQUE: Contiguous axial images were obtained from the base of the skull through the vertex without contrast.  COMPARISON:  MR brain 04/03/2010.  FINDINGS: No evidence for acute infarction, hemorrhage, mass lesion, hydrocephalus, or extra-axial fluid. Moderate atrophy. Chronic microvascular ischemic change. Vascular calcification. Calvarium intact. Clear sinuses and mastoids.  IMPRESSION: No acute intracranial abnormality. Atrophy and small vessel disease. Similar appearance to priors.   Electronically Signed   By: Davonna Belling M.D.   On: 04/16/2013 20:25   Dg Pelvis Portable  04/17/2013   CLINICAL DATA:  Status post left hip replacement.  EXAM: PORTABLE PELVIS  COMPARISON:  Single view of the pelvis 11/22/2010.  FINDINGS: The patient has a new bipolar left hip hemiarthroplasty. The device is located. There is no fracture. Gas the soft tissues from surgery is noted. Calcified uterine fibroid also seen.  IMPRESSION: Left hip  replacement without  acute abnormality.   Electronically Signed   By: Drusilla Kanner M.D.   On: 04/17/2013 18:18   Dg Chest Port 1 View  04/18/2013   CLINICAL DATA:  Hypoxia question infiltrates, weakness, shortness of breath, history Alzheimer's  EXAM: PORTABLE CHEST - 1 VIEW  COMPARISON:  Portable exam 2004 hr compared to 04/16/2013  FINDINGS: Enlargement of cardiac silhouette with pulmonary vascular congestion.  Atherosclerotic calcification aorta.  Scattered interstitial infiltrates question edema versus infection.  More focal consolidation over atelectasis present at bases greater on left.  No pneumothorax or definite pleural effusion.  Significant osseous demineralization with posttraumatic deformity of the proximal left humerus.  IMPRESSION: Enlargement of cardiac silhouette.  Bibasilar atelectasis versus consolidation with suspect bilateral diffuse infiltrates which could represent edema or infection.   Electronically Signed   By: Ulyses Southward M.D.   On: 04/18/2013 22:00    Microbiology: Recent Results (from the past 240 hour(s))  SURGICAL PCR SCREEN     Status: None   Collection Time    04/17/13  2:18 PM      Result Value Range Status   MRSA, PCR NEGATIVE  NEGATIVE Final   Staphylococcus aureus NEGATIVE  NEGATIVE Final   Comment:            The Xpert SA Assay (FDA     approved for NASAL specimens     in patients over 73 years of age),     is one component of     a comprehensive surveillance     program.  Test performance has     been validated by [employer] for patients greater     than or equal to 73 year old.     It is not intended     to diagnose infection nor to     guide or monitor treatment.     Labs: Basic Metabolic Panel:  Recent Labs Lab 04/19/13 0555 04/20/13 0500 04/21/13 0445 04/22/13 0400 04/23/13 0425  NA 137 140 140 141 137  K 3.5 3.7 3.3* 4.3 4.7  CL 105 104 101 103 100  CO2 26 30 32 30 27  GLUCOSE 144* 107* 118* 116* 106*  BUN 14 13 19 23   24*  CREATININE 0.55 0.63 0.66 0.70 0.69  CALCIUM 8.1* 8.5 8.7 9.3 9.1   Liver Function Tests:  Recent Labs Lab 04/16/13 2007  AST 26  ALT 16  ALKPHOS 88  BILITOT 0.5  PROT 6.8  ALBUMIN 3.0*   No results found for this basename: LIPASE, AMYLASE,  in the last 168 hours No results found for this basename: AMMONIA,  in the last 168 hours CBC:  Recent Labs Lab 04/18/13 0531 04/19/13 0555 04/21/13 0445 04/22/13 0400 04/23/13 0425  WBC 6.4 7.2 10.7* 9.7 9.8  HGB 9.5* 9.1* 10.2* 10.0* 10.6*  HCT 30.0* 27.9* 31.2* 31.0* 33.2*  MCV 90.6 90.6 89.1 89.3 90.0  PLT 139* 149* 231 287 296   Cardiac Enzymes: No results found for this basename: CKTOTAL, CKMB, CKMBINDEX, TROPONINI,  in the last 168 hours BNP: BNP (last 3 results)  Recent Labs  04/20/13 0500  PROBNP 2931.0*   CBG: No results found for this basename: GLUCAP,  in the last 168 hours     Signed:  Tyan Lasure C  [facility] 04/23/2013, 1:07 PM

## 2013-04-23 NOTE — Progress Notes (Signed)
Physical Therapy Treatment Patient Details Name: Peggy Pratt MRN: [id number] DOB: 1920/11/19 Today's Date: 04/23/2013 Time: 4098-1191 PT Time Calculation (min): 20 min  PT Assessment / Plan / Recommendation  History of Present Illness L hip hemi   PT Comments   Pt emotional at times. . Pt did ambulate and use BR after taken to it. For Snf  Follow Up Recommendations  SNF     Does the patient have the potential to tolerate intense rehabilitation     Barriers to Discharge        Equipment Recommendations       Recommendations for Other Services    Frequency Min 3X/week   Progress towards PT Goals Progress towards PT goals: Progressing toward goals  Plan Current plan remains appropriate    Precautions / Restrictions Precautions Precautions: Posterior Hip;Fall Precaution Comments: Hx dementia, requires Honey Thicken Liquids Restrictions Other Position/Activity Restrictions: WBAT   Pertinent Vitals/Pain No real c/o    Mobility  Bed Mobility Supine to Sit: 3: Mod assist Sit to Supine: 3: Mod assist Details for Bed Mobility Assistance: pt attempting OOB ,  Transfers Sit to Stand: 3: Mod assist;From bed;From toilet;With upper extremity assist Stand to Sit: To bed;To toilet;With upper extremity assist Stand Pivot Transfers: 3: Mod assist Details for Transfer Assistance: multimodal cues to stand and sit down. extra time for handover hand cues to sit down, pt just stands up. Ambulation/Gait Ambulation/Gait Assistance: 1: +2 Total assist Ambulation/Gait: Patient Percentage: 60% Ambulation Distance (Feet): 15 Feet (x2) Assistive device: Rolling walker Ambulation/Gait Assistance Details: multimodal cues for use of RW, safety with RW. Pt really does not follow verbal commands. requires strong tactile cues. Gait Pattern: Step-through pattern;Antalgic Gait velocity: decreased    Exercises     PT Diagnosis:    PT Problem List:   PT Treatment Interventions:     PT Goals  (current goals can now be found in the care plan section)    Visit Information  Last PT Received On: 04/23/13 Assistance Needed: +2 History of Present Illness: L hip hemi    Subjective Data      Cognition  Cognition Arousal/Alertness: Awake/alert Behavior During Therapy: Restless Overall Cognitive Status: History of cognitive impairments - at baseline    Balance  Static Sitting Balance Static Sitting - Balance Support: No upper extremity supported Static Sitting - Level of Assistance: 3: Mod assist Static Sitting - Comment/# of Minutes: stood to wash hands at the sink, posterior lean.  End of Session PT - End of Session Activity Tolerance: Patient tolerated treatment well Patient left: in bed;with call bell/phone within reach;with bed alarm set   GP     Rada Hay 04/23/2013, 4:45 PM

## 2013-04-23 NOTE — Progress Notes (Signed)
Patient ID: Peggy Pratt, female   DOB: 04-07-1921, 79 y.o.   MRN: [id number] Subjective: 6 Days Post-Op Procedure(s) (LRB): ARTHROPLASTY BIPOLAR HIP (Left)    Patient reports pain as mild.  Objective:   VITALS:   Filed Vitals:   04/23/13 0547  BP: 126/75  Pulse: 72  Temp: 97.9 F (36.6 C)  Resp: 16    Neurovascular intact Incision: dressing C/D/I, some bruising left thigh/buttock  LABS  Recent Labs  04/21/13 0445 04/22/13 0400 04/23/13 0425  HGB 10.2* 10.0* 10.6*  HCT 31.2* 31.0* 33.2*  WBC 10.7* 9.7 9.8  PLT 231 287 296     Recent Labs  04/21/13 0445 04/22/13 0400 04/23/13 0425  NA 140 141 137  K 3.3* 4.3 4.7  BUN 19 23 24*  CREATININE 0.66 0.70 0.69  GLUCOSE 118* 116* 106*    No results found for this basename: LABPT, INR,  in the last 72 hours   Assessment/Plan: 6 Days Post-Op Procedure(s) (LRB): ARTHROPLASTY BIPOLAR HIP (Left)   Up with therapy Discharge to SNF when medically stable  RTC 2 weeks

## 2013-04-23 NOTE — Progress Notes (Signed)
04/23/2013 Colleen Can BSN RN CCM [phone] Pt discharging to SNF today. Arranged by CSW.

## 2013-04-23 NOTE — Progress Notes (Signed)
Speech Language Pathology Treatment: Dysphagia  Patient Details Name: SHANTERICA BIEHLER MRN: [id number] DOB: 1921-01-10 Today's Date: 04/23/2013 Time: 1191-4782 SLP Time Calculation (min): 13 min  Assessment / Plan / Recommendation Clinical Impression  Pt seen today to assess tolerance of po diet, readiness for dietary advancement.  Pt noted to be sitting upright in bed, fully alert and conversive with strong voice apparent.  Pt's swallow ability has clinically improved significantly compared to Friday and Saturday.    Note intake continues to be poor. Pt accepted only single bite of cracker, straw sip of thin water x2, and nectar thick water *found at bedside x3 sips.  Slow mastication with cracker noted, exacerbated by pt's decreased attention and pt reported mild oral residuals.  No s/s of aspiration with thin, nectar, cracker alone.  Strong cough x1 after thin liquid when consumed to try to clear oral cracker stasis - suspect premature spillage into larynx/trachea of thin water due to pt's attempt to manipulate mixed consistency.    Recommend quick follow up with SLP at SNF to advance diet as soon as pt clinically indicated.  Consideration for advancement to soft/ground/nectar upon admit to SNF may be helpful to maximize intake and mitigate aspiration risk.     HPI HPI: OSMARA DRUMMONDS is an 79 y.o. female with hx of advanced dementia, osteoporosis, recents falls, SNF resident with DNR code statuts, recent wrist and shouldder Fx, fell unwitnessed at the SNF last Tuesday, brought to the ER as hip Xray finally showed displaced Fx of the left hip.  She had 2 prior xrays which didn't show any Fx, but this last one did.  X ray repeated here showed slightly displaced left humeral neck Fx.  Her head CT was negative, and serology was unremarkable.  Her CXR showed mild congestion upon admit.  Pt had been seen for a clinical swallow evaluation on Friday 04/20/13 and follow up on Saturday 04/21/13 with  puree/honey thick diet initiaited.  Intake has been documented to be poor.  SLP follow up today to assess tolerance and readiness for dietary advancement.  Pt to dc to [facility] today.    Pertinent Vitals Decreased, strong cough, afebrile, 10% po intake  SLP Plan  Goals updated    Recommendations Diet recommendations: Dysphagia 3 (mechanical soft);Nectar-thick liquid (consider advancing at SNF and follow up with SNF SLP for dysphagia tx) Liquids provided via: Cup;Straw Medication Administration: Whole meds with puree (follow with liquids) Supervision: Patient able to self feed;Intermittent supervision to cue for compensatory strategies Compensations: Slow rate;Small sips/bites;Check for pocketing Postural Changes and/or Swallow Maneuvers: Seated upright 90 degrees              Oral Care Recommendations: Oral care before and after PO Follow up Recommendations: Skilled Nursing facility Plan: Goals updated    GO     Donavan Burnet, MS [facility] SLP [phone]

## 2013-04-23 NOTE — Progress Notes (Signed)
Patient is set to discharge back to [facility] today. Patient's son made aware. Discharge packet in [place]. PTAR scheduled for 3:30p pickup (Service Request Id: [id number]).   Unice Bailey, LCSW [facility] Clinical Social Worker cell #: [phone]

## 2013-06-01 ENCOUNTER — Emergency Department (HOSPITAL_COMMUNITY)
Admission: EM | Admit: 2013-06-01 | Discharge: 2013-06-01 | Disposition: A | Discharge status: Skilled Nursing Facility | Payer: Medicare Other | Attending: Emergency Medicine | Admitting: Emergency Medicine

## 2013-06-01 ENCOUNTER — Encounter (HOSPITAL_COMMUNITY): Payer: Self-pay | Admitting: Emergency Medicine

## 2013-06-01 ENCOUNTER — Emergency Department (HOSPITAL_COMMUNITY): Payer: Medicare Other

## 2013-06-01 DIAGNOSIS — F0390 Unspecified dementia without behavioral disturbance: Secondary | ICD-10-CM

## 2013-06-01 DIAGNOSIS — G309 Alzheimer's disease, unspecified: Secondary | ICD-10-CM | POA: Insufficient documentation

## 2013-06-01 DIAGNOSIS — Z9181 History of falling: Secondary | ICD-10-CM | POA: Insufficient documentation

## 2013-06-01 DIAGNOSIS — W19XXXA Unspecified fall, initial encounter: Secondary | ICD-10-CM

## 2013-06-01 DIAGNOSIS — Y9301 Activity, walking, marching and hiking: Secondary | ICD-10-CM | POA: Insufficient documentation

## 2013-06-01 DIAGNOSIS — Z7901 Long term (current) use of anticoagulants: Secondary | ICD-10-CM | POA: Insufficient documentation

## 2013-06-01 DIAGNOSIS — Z8781 Personal history of (healed) traumatic fracture: Secondary | ICD-10-CM | POA: Insufficient documentation

## 2013-06-01 DIAGNOSIS — Y9210 Unspecified residential institution as the place of occurrence of the external cause: Secondary | ICD-10-CM | POA: Insufficient documentation

## 2013-06-01 DIAGNOSIS — Z880 Allergy status to penicillin: Secondary | ICD-10-CM | POA: Insufficient documentation

## 2013-06-01 DIAGNOSIS — S0180XA Unspecified open wound of other part of head, initial encounter: Secondary | ICD-10-CM | POA: Insufficient documentation

## 2013-06-01 DIAGNOSIS — W010XXA Fall on same level from slipping, tripping and stumbling without subsequent striking against object, initial encounter: Secondary | ICD-10-CM | POA: Insufficient documentation

## 2013-06-01 DIAGNOSIS — F0280 Dementia in other diseases classified elsewhere without behavioral disturbance: Secondary | ICD-10-CM | POA: Insufficient documentation

## 2013-06-01 DIAGNOSIS — S0990XA Unspecified injury of head, initial encounter: Secondary | ICD-10-CM

## 2013-06-01 LAB — GLUCOSE, CAPILLARY: Glucose-Capillary: 131 mg/dL — ABNORMAL HIGH (ref 70–99)

## 2013-06-01 MED ORDER — ACETAMINOPHEN 325 MG PO TABS: 650.0000 mg | ORAL_TABLET | Freq: Once | ORAL | Status: DC

## 2013-06-01 NOTE — ED Notes (Signed)
Per EMS:  Per Nursing home: pt. Was walking into the TV room and tripped and fell. Pt. Is noted with a Laceration to the left eye. Pt. Has no complaints at this time.  Pt. Was sent due to being on blood thinners.

## 2013-06-01 NOTE — ED Provider Notes (Addendum)
CSN: [id number]     Arrival date & time 06/01/13  1947 History   First MD Initiated Contact with Patient 06/01/13 2013     Chief Complaint  Patient presents with  . Fall  . Facial Laceration  . Blood thinners    (Consider location/radiation/quality/duration/timing/severity/associated sxs/prior Treatment) HPI Comments: 79 yo female with alzheimers, lives in NH presents with head injury PTA.  Pt had witnessed left sided head injury without loc, on asa.  No other injuries.  Abrasion left outer eye.  Pain to palpation.  Unable to get details from pt due to dementia.   Patient is a 80 y.o. female presenting with fall. The history is provided by the patient and the nursing home.  Fall This is a new problem.    Past Medical History  Diagnosis Date  . Alzheimer's dementia   . Recurrent falls   . Compression fracture    Past Surgical History  Procedure Laterality Date  . Wrist surgery Right   . Cataract extraction, bilateral    . Hip arthroplasty Left 04/17/2013    Procedure: ARTHROPLASTY BIPOLAR HIP;  Surgeon: Shelda Pal, MD;  Location: [facility] ORS;  Service: Orthopedics;  Laterality: Left;   Family History  Problem Relation Age of Onset  . Alzheimer's disease Neg Hx   . Heart disease Neg Hx    History  Substance Use Topics  . Smoking status: Never Smoker   . Smokeless tobacco: Never Used  . Alcohol Use: No   OB History   Grav Para Term Preterm Abortions TAB SAB Ect Mult Living                 Review of Systems  Unable to perform ROS: Dementia    Allergies  Penicillins  Home Medications   Current Outpatient Rx  Name  Route  Sig  Dispense  Refill  . docusate sodium 100 MG CAPS   Oral   Take 100 mg by mouth 2 (two) times daily.   10 capsule   0   . warfarin (COUMADIN) 2 MG tablet   Oral   Take 2 mg by mouth See admin instructions. On Monday, Wednesday, and Friday at bedtime.          BP 126/69  Pulse 94  Temp(Src) 97.8 F (36.6 C) (Oral)  Resp 20  SpO2  97% Physical Exam  Nursing note and vitals reviewed. Constitutional: She appears well-developed and well-nourished.  HENT:  Head: Normocephalic.  Left lateral periorbital edema/ abrasion/ superficial laceration  Eyes: Conjunctivae are normal. Right eye exhibits no discharge. Left eye exhibits no discharge.  Neck: Normal range of motion. Neck supple. No tracheal deviation present.  Cardiovascular: Normal rate and regular rhythm.   Pulmonary/Chest: Effort normal and breath sounds normal.  Abdominal: Soft. She exhibits no distension. There is no tenderness. There is no guarding.  Musculoskeletal: She exhibits tenderness (left periorbital, no step off). She exhibits no edema.  No tenderness cervical, lateral movement head without discomfort Left shoulder in sling from previous fx  Neurological: She is alert.  Pleasant dementia Moves extremities equal bilateral with general weakness  Skin: Skin is warm. No rash noted.    ED Course  Procedures (including critical care time) Labs Review Labs Reviewed  GLUCOSE, CAPILLARY - Abnormal; Notable for the following:    Glucose-Capillary 131 (*)    All other components within normal limits   Imaging Review Ct Head Wo Contrast  06/01/2013   CLINICAL DATA:  79 year old female with head  injury and headache following fall.  EXAM: CT HEAD WITHOUT CONTRAST  TECHNIQUE: Contiguous axial images were obtained from the base of the skull through the vertex without intravenous contrast.  COMPARISON:  04/16/2013 CT and 03/17/2010 MR  FINDINGS: Chronic small-vessel white matter ischemic changes are again noted.  No acute intracranial abnormalities are identified, including mass lesion or mass effect, hydrocephalus, extra-axial fluid collection, midline shift, hemorrhage, or acute infarction. The visualized bony calvarium is unremarkable.  Left preseptal facial soft tissue swelling is present.  IMPRESSION: No evidence of acute intracranial abnormality.  Left facial  soft tissue swelling without fracture.  Chronic small-vessel white matter ischemic changes.   Electronically Signed   By: Laveda Abbe M.D.   On: 06/01/2013 20:55    EKG Interpretation   None       MDM   1. Fall, initial encounter   2. Head injury, initial encounter   3. Dementia    Pt at baseline in ED per report. Left facial abrasion, no stitches required. CT no acute findings.   Results and differential diagnosis were discussed with the patient. Close follow up outpatient was discussed, patient comfortable with the plan.  Filed Vitals:   06/01/13 1955  BP: 126/69  Pulse: 94  Temp: 97.8 F (36.6 C)  TempSrc: Oral  Resp: 20  SpO2: 97%   Diagnosis: Dementia, Fall, head injury   Enid Skeens, MD 06/01/13 2118  Enid Skeens, MD 06/01/13 2120  Long discussion with family, multiple falls, pt has ortho fup, known left hip and left shoulder fx. Family okay without blood work at this time, will fup with ortho and pcp, pt at baseline.   Enid Skeens, MD 06/01/13 [phone]

## 2013-06-01 NOTE — ED Notes (Signed)
Report called to Morrie Sheldon, RN, Consulting civil engineer.

## 2013-06-01 NOTE — ED Notes (Signed)
Pt.'s family here. Updated per MD re: plan of care. Family left for the night. Pt. Waiting for PTAR to arrive.

## 2013-06-01 NOTE — ED Notes (Signed)
Report called to [facility]. 

## 2013-06-08 ENCOUNTER — Emergency Department (HOSPITAL_COMMUNITY)
Admission: EM | Admit: 2013-06-08 | Discharge: 2013-06-09 | Disposition: A | Discharge status: Home / Self Care | Payer: Medicare Other | Attending: Emergency Medicine | Admitting: Emergency Medicine

## 2013-06-08 ENCOUNTER — Other Ambulatory Visit: Payer: Self-pay

## 2013-06-08 ENCOUNTER — Emergency Department (HOSPITAL_COMMUNITY): Payer: Medicare Other

## 2013-06-08 DIAGNOSIS — R627 Adult failure to thrive: Secondary | ICD-10-CM | POA: Insufficient documentation

## 2013-06-08 DIAGNOSIS — Z7901 Long term (current) use of anticoagulants: Secondary | ICD-10-CM | POA: Insufficient documentation

## 2013-06-08 DIAGNOSIS — G309 Alzheimer's disease, unspecified: Secondary | ICD-10-CM | POA: Insufficient documentation

## 2013-06-08 DIAGNOSIS — F0280 Dementia in other diseases classified elsewhere without behavioral disturbance: Secondary | ICD-10-CM | POA: Insufficient documentation

## 2013-06-08 DIAGNOSIS — Z8781 Personal history of (healed) traumatic fracture: Secondary | ICD-10-CM | POA: Insufficient documentation

## 2013-06-08 DIAGNOSIS — F0390 Unspecified dementia without behavioral disturbance: Secondary | ICD-10-CM

## 2013-06-08 DIAGNOSIS — Z79899 Other long term (current) drug therapy: Secondary | ICD-10-CM | POA: Insufficient documentation

## 2013-06-08 DIAGNOSIS — Z880 Allergy status to penicillin: Secondary | ICD-10-CM | POA: Insufficient documentation

## 2013-06-08 DIAGNOSIS — IMO0002 Reserved for concepts with insufficient information to code with codable children: Secondary | ICD-10-CM

## 2013-06-08 DIAGNOSIS — R5381 Other malaise: Secondary | ICD-10-CM | POA: Insufficient documentation

## 2013-06-08 DIAGNOSIS — J189 Pneumonia, unspecified organism: Secondary | ICD-10-CM

## 2013-06-08 DIAGNOSIS — R4182 Altered mental status, unspecified: Secondary | ICD-10-CM | POA: Insufficient documentation

## 2013-06-08 LAB — COMPREHENSIVE METABOLIC PANEL
ALT: 13 U/L (ref 0–35)
AST: 53 U/L — ABNORMAL HIGH (ref 0–37)
Albumin: 3.1 g/dL — ABNORMAL LOW (ref 3.5–5.2)
Alkaline Phosphatase: 130 U/L — ABNORMAL HIGH (ref 39–117)
Glucose, Bld: 110 mg/dL — ABNORMAL HIGH (ref 70–99)
Potassium: 5.9 mEq/L — ABNORMAL HIGH (ref 3.5–5.1)
Sodium: 134 mEq/L — ABNORMAL LOW (ref 135–145)
Total Protein: 7.8 g/dL (ref 6.0–8.3)

## 2013-06-08 LAB — URINALYSIS, ROUTINE W REFLEX MICROSCOPIC
Glucose, UA: NEGATIVE mg/dL
Specific Gravity, Urine: 1.02 (ref 1.005–1.030)
pH: 6.5 (ref 5.0–8.0)

## 2013-06-08 LAB — URINE MICROSCOPIC-ADD ON

## 2013-06-08 LAB — CBC
Hemoglobin: 12.6 g/dL (ref 12.0–15.0)
MCH: 29.2 pg (ref 26.0–34.0)
MCHC: 32.7 g/dL (ref 30.0–36.0)
RDW: 15 % (ref 11.5–15.5)

## 2013-06-08 LAB — LACTIC ACID, PLASMA: Lactic Acid, Venous: 0.9 mmol/L (ref 0.5–2.2)

## 2013-06-08 MED ORDER — VANCOMYCIN HCL IN DEXTROSE 750-5 MG/150ML-% IV SOLN: 750.0000 mg | INTRAVENOUS | Status: DC

## 2013-06-08 MED ORDER — SODIUM CHLORIDE 0.9 % IV BOLUS (SEPSIS)
500.0000 mL | Freq: Once | INTRAVENOUS | Status: AC
  Administered 2013-06-08: 500 mL via INTRAVENOUS

## 2013-06-08 MED ORDER — AZITHROMYCIN 250 MG PO TABS: ORAL_TABLET | ORAL | Status: AC

## 2013-06-08 MED ORDER — DEXTROSE 5 % IV SOLN
2.0000 g | Freq: Three times a day (TID) | INTRAVENOUS | Status: DC
  Filled 2013-06-08: qty 2

## 2013-06-08 MED ORDER — DEXTROSE 5 % IV SOLN
1.0000 g | INTRAVENOUS | Status: AC
  Administered 2013-06-08: 1 g via INTRAVENOUS
  Filled 2013-06-08: qty 1

## 2013-06-08 MED ORDER — VANCOMYCIN HCL IN DEXTROSE 750-5 MG/150ML-% IV SOLN
750.0000 mg | INTRAVENOUS | Status: AC
  Administered 2013-06-08: 750 mg via INTRAVENOUS
  Filled 2013-06-08: qty 150

## 2013-06-08 MED ORDER — DEXTROSE 5 % IV SOLN: 1.0000 g | INTRAVENOUS | Status: AC

## 2013-06-08 NOTE — ED Provider Notes (Addendum)
CSN: [id number]     Arrival date & time 06/08/13  1747 History   First MD Initiated Contact with Patient 06/08/13 1801     Chief Complaint  Patient presents with  . Altered Mental Status  . Extremity Weakness  . Headache   (Consider location/radiation/quality/duration/timing/severity/associated sxs/prior Treatment) Patient is a 79 y.o. female presenting with altered mental status, extremity weakness, and headaches. The history is provided by the patient and a relative. The history is limited by the condition of the patient.  Altered Mental Status Associated symptoms: headaches   Extremity Weakness Associated symptoms include headaches.  Headache pt with hx dementia, frequent falls, presents w altered mental status.  Family went to see patient today and not pt not as alert as normal, appeared generally weak, less able to ambulate and/or bear weight, so brought to ED for evaluation.  Pt w hx frequent falls, w prior LUE and left hip fxs, although family/ecf do not report any recurrent fall since last ed visit.  Family notes pt had c/o pain all over and head pain earlier this afternoon, stating it is unusual for patient to c/o of pain. Family unsure of po intake. +prior hx uti. Pt w dementia, limited historian, level 5 caveat.     Past Medical History  Diagnosis Date  . Alzheimer's dementia   . Recurrent falls   . Compression fracture    Past Surgical History  Procedure Laterality Date  . Wrist surgery Right   . Cataract extraction, bilateral    . Hip arthroplasty Left 04/17/2013    Procedure: ARTHROPLASTY BIPOLAR HIP;  Surgeon: Shelda Pal, MD;  Location: [facility];  Service: Orthopedics;  Laterality: Left;   Family History  Problem Relation Age of Onset  . Alzheimer's disease Neg Hx   . Heart disease Neg Hx    History  Substance Use Topics  . Smoking status: Never Smoker   . Smokeless tobacco: Never Used  . Alcohol Use: No   OB History   Grav Para Term Preterm Abortions TAB  SAB Ect Mult Living                 Review of Systems  Unable to perform ROS: Dementia  Musculoskeletal: Positive for extremity weakness.  Neurological: Positive for headaches.  level 5 caveat.     Allergies  Penicillins  Home Medications   Current Outpatient Rx  Name  Route  Sig  Dispense  Refill  . docusate sodium 100 MG CAPS   Oral   Take 100 mg by mouth 2 (two) times daily.   10 capsule   0   . warfarin (COUMADIN) 2 MG tablet   Oral   Take 2 mg by mouth See admin instructions. On Monday, Wednesday, and Friday at bedtime.          BP 148/114  Pulse 64  Resp 18  SpO2 96% Physical Exam  Nursing note and vitals reviewed. Constitutional: No distress.  Frail, elderly female, appears uncomfortable.   HENT:  Head: Atraumatic.  Nose: Nose normal.  Mouth/Throat: Oropharynx is clear and moist.  No sinus or temporal tenderness.   Eyes: Conjunctivae are normal. Pupils are equal, round, and reactive to light. No scleral icterus.  Neck: Normal range of motion. Neck supple. No tracheal deviation present.  No stiffness or rigidity  Cardiovascular: Normal rate, regular rhythm, normal heart sounds and intact distal pulses.   Pulmonary/Chest: Effort normal. No respiratory distress.  Rhonchi/left base. Coughing.    Abdominal: Soft. Normal  appearance and bowel sounds are normal. She exhibits no distension and no mass. There is no tenderness. There is no rebound and no guarding.  Genitourinary:  No cva tenderness  Musculoskeletal: She exhibits no edema.  Left shoulder in sling.  No focal extremity soft tissues swelling or area of new contusion/injury noted.   Neurological: She is alert.  Awake and alert. Moves bil extremities purposefully. Equal grip.  No facial asymmetry.   Skin: Skin is warm and dry. No rash noted. She is not diaphoretic.  Psychiatric:  Alert, responsive. Baseline dementia/confusion per family.     ED Course  Procedures (including critical care  time)  Results for orders placed during the hospital encounter of 06/08/13  CBC      Result Value Range   WBC 6.3  4.0 - 10.5 K/uL   RBC 4.32  3.87 - 5.11 MIL/uL   Hemoglobin 12.6  12.0 - 15.0 g/dL   HCT 16.1  09.6 - 04.5 %   MCV 89.1  78.0 - 100.0 fL   MCH 29.2  26.0 - 34.0 pg   MCHC 32.7  30.0 - 36.0 g/dL   RDW 40.9  81.1 - 91.4 %   Platelets 122 (*) 150 - 400 K/uL  COMPREHENSIVE METABOLIC PANEL      Result Value Range   Sodium 134 (*) 135 - 145 mEq/L   Potassium 5.9 (*) 3.5 - 5.1 mEq/L   Chloride 98  96 - 112 mEq/L   CO2 26  19 - 32 mEq/L   Glucose, Bld 110 (*) 70 - 99 mg/dL   BUN 20  6 - 23 mg/dL   Creatinine, Ser 7.82  0.50 - 1.10 mg/dL   Calcium 8.9  8.4 - 95.6 mg/dL   Total Protein 7.8  6.0 - 8.3 g/dL   Albumin 3.1 (*) 3.5 - 5.2 g/dL   AST 53 (*) 0 - 37 U/L   ALT 13  0 - 35 U/L   Alkaline Phosphatase 130 (*) 39 - 117 U/L   Total Bilirubin 0.2 (*) 0.3 - 1.2 mg/dL   GFR calc non Af Amer 79 (*) >90 mL/min   GFR calc Af Amer >90  >90 mL/min  PROTIME-INR      Result Value Range   Prothrombin Time 17.6 (*) 11.6 - 15.2 seconds   INR 1.49  0.00 - 1.49  URINALYSIS, ROUTINE W REFLEX MICROSCOPIC      Result Value Range   Color, Urine YELLOW  YELLOW   APPearance CLEAR  CLEAR   Specific Gravity, Urine 1.020  1.005 - 1.030   pH 6.5  5.0 - 8.0   Glucose, UA NEGATIVE  NEGATIVE mg/dL   Hgb urine dipstick LARGE (*) NEGATIVE   Bilirubin Urine NEGATIVE  NEGATIVE   Ketones, ur NEGATIVE  NEGATIVE mg/dL   Protein, ur NEGATIVE  NEGATIVE mg/dL   Urobilinogen, UA 0.2  0.0 - 1.0 mg/dL   Nitrite NEGATIVE  NEGATIVE   Leukocytes, UA NEGATIVE  NEGATIVE  URINE MICROSCOPIC-ADD ON      Result Value Range   Squamous Epithelial / LPF RARE  RARE   WBC, UA 0-2  <3 WBC/hpf   RBC / HPF 11-20  <3 RBC/hpf   Bacteria, UA RARE  RARE   Casts GRANULAR CAST (*) NEGATIVE  LACTIC ACID, PLASMA      Result Value Range   Lactic Acid, Venous 0.9  0.5 - 2.2 mmol/L  POTASSIUM      Result Value Range  Potassium 3.9  3.5 - 5.1 mEq/L   Dg Chest 2 View  06/08/2013   CLINICAL DATA:  Pain, shoulder pain  EXAM: CHEST  2 VIEW  COMPARISON:  04/18/2013  FINDINGS: Cardiomediastinal silhouette is stable. Chronic interstitial prominence. There is hazy atelectasis or infiltrate left base retrocardiac. No pulmonary edema. Osteopenia and degenerative changes thoracic spine. Chronic fracture deformity of left proximal humerus.  IMPRESSION: Chronic interstitial prominence. There is hazy atelectasis or infiltrate left base retrocardiac. No pulmonary edema. Osteopenia and degenerative changes thoracic spine. Chronic fracture deformity of left proximal humerus.   Electronically Signed   By: Natasha Mead M.D.   On: 06/08/2013 19:02   Ct Head Wo Contrast  06/08/2013   CLINICAL DATA:  Altered mental status.  EXAM: CT HEAD WITHOUT CONTRAST  TECHNIQUE: Contiguous axial images were obtained from the base of the skull through the vertex without intravenous contrast.  COMPARISON:  06/01/2013.  FINDINGS: Stable diffusely enlarged ventricles and subarachnoid spaces. No significant change in patchy white matter low density in both cerebral hemispheres. No intracranial hemorrhage, mass lesion or CT evidence of acute infarction. Unremarkable bones and included paranasal sinuses.  IMPRESSION: No acute abnormality. Stable atrophy and chronic small vessel white matter ischemic changes.   Electronically Signed   By: Gordan Payment M.D.   On: 06/08/2013 18:54   Ct Head Wo Contrast  06/01/2013   CLINICAL DATA:  79 year old female with head injury and headache following fall.  EXAM: CT HEAD WITHOUT CONTRAST  TECHNIQUE: Contiguous axial images were obtained from the base of the skull through the vertex without intravenous contrast.  COMPARISON:  04/16/2013 CT and 03/17/2010 MR  FINDINGS: Chronic small-vessel white matter ischemic changes are again noted.  No acute intracranial abnormalities are identified, including mass lesion or mass effect,  hydrocephalus, extra-axial fluid collection, midline shift, hemorrhage, or acute infarction. The visualized bony calvarium is unremarkable.  Left preseptal facial soft tissue swelling is present.  IMPRESSION: No evidence of acute intracranial abnormality.  Left facial soft tissue swelling without fracture.  Chronic small-vessel white matter ischemic changes.   Electronically Signed   By: Laveda Abbe M.D.   On: 06/01/2013 20:55      EKG Interpretation   None       MDM  Iv ns bolus. Labs. Ua. Cxr. Ct.  Reviewed nursing notes and prior charts for additional history.    Date: 06/08/2013  Rate: 101  Rhythm: sinus tachycardia  QRS Axis: left  Intervals: normal  ST/T Wave abnormalities: normal  Conduction Disutrbances:none  Narrative Interpretation:   Old EKG Reviewed: unchanged  Pt with chest congestion, left lower lobe rale.   cxr w ?infiltrate.   Given weakness, altered mental status, concern for possible hcap, will call pcp to admit.  Discussed pt with Dr Evlyn Kanner, on call for Dr Eloise Harman, incl pts hx, recent hx, vitals, labs, cxr, etc - he indicates at ecf they can give same rx as here, that they will give ivf, iv abx - he indicates they will place pt on rocephin/zithromax, and ensure close follow up of pt, indicating Dr Eloise Harman will be rounding at [facility] this weekend.  Discussed plan w daughter - agreeable w plan.   Will give ivf while in ed, iv abx, and plan for d/c back to ecf w pcp service following closely.    Recheck pt comfortable. Resting. No resp distress.  Will d/c back to ecf w close follow up per pcp plan.   Initial k elevated, but hemolyzed, repeat  normal.   Recheck bp, hr, rr, normal.  Pt to return to ecf via ems.    Suzi Roots, MD 06/08/13 [phone]

## 2013-06-08 NOTE — ED Notes (Addendum)
Patient on Coumadin reports to ED with family for altered mental status. Pt has history of frequent falls and head injury on Nov 20 when she fell face first. Pt had CT which was clear, but MD noted that it is possible for bleeding to occur later. Normally patient can stand and pivot, today pt was unable to stand at all when family arrived, pt is slumping to the left and heavily guarding left side, with garbled speech and unable to follow commands or respond to verbal stimulation. At base line pt is able to converse in confused conversation and follow commands. Pt writhing, crying in pain and clutching forehead. Family states that even with frequent fractures pt does not usually complain of pain. Pt is DNR. Last seen normal by family Nov 29, facility states pt was normal this am, family admits facility may not have known if pt was normal.

## 2013-06-08 NOTE — ED Notes (Signed)
Bed: WU98 Expected date:  Expected time:  Means of arrival:  Comments: HOLD-per charge

## 2013-06-08 NOTE — ED Notes (Signed)
Patient transported to CT 

## 2013-06-08 NOTE — Progress Notes (Signed)
ANTIBIOTIC CONSULT NOTE - INITIAL  Pharmacy Consult for vancomycin, aztreonam Indication: HCAP  Allergies  Allergen Reactions  . Penicillins     Unknown reaction on [facility]    Patient Measurements: Height: 5\' 4"  (162.6 cm) Weight: 112 lb (50.803 kg) IBW/kg (Calculated) : 54.7  Vital Signs: Temp: 99.9 F (37.7 C) (12/05 1901) Temp src: Rectal (12/05 1901) BP: 111/66 mmHg (12/05 1953) Pulse Rate: 94 (12/05 1953) Intake/Output from previous day:   Intake/Output from this shift:    Labs:  Recent Labs  06/08/13 2015  WBC 6.3  HGB 12.6  PLT 122*  CREATININE 0.57   Estimated Creatinine Clearance: 37.5 ml/min (by C-G formula based on Cr of 0.57). No results found for this basename: VANCOTROUGH, VANCOPEAK, VANCORANDOM, GENTTROUGH, GENTPEAK, GENTRANDOM, TOBRATROUGH, TOBRAPEAK, TOBRARND, AMIKACINPEAK, AMIKACINTROU, AMIKACIN,  in the last 72 hours   Microbiology: No results found for this or any previous visit (from the past 720 hour(s)).  Medical History: Past Medical History  Diagnosis Date  . Alzheimer's dementia   . Recurrent falls   . Compression fracture     Medications:  Scheduled:  . vancomycin  750 mg Intravenous STAT   Infusions:  . aztreonam 1 g (06/08/13 2109)   Assessment: 79 yo presents to ER with AMS also on warfarin PTA. Patient with hx falls and head injury on 11/20 to start on vanc and aztreonam for HCAP. SCr stable but est CrCl only 38 ml/min due to advanced age. WBC stable. afebrile  Goal of Therapy:  Vancomycin trough level 15-20 mcg/ml  Plan:  1) Vancomycin 750mg  IV q24  2) Aztreonam 2g IV q8 for CrCl > 30 ml/min   Hessie Knows, PharmD, BCPS Pager [phone] 06/08/2013 9:34 PM

## 2013-06-08 NOTE — ED Notes (Signed)
MD at bedside. 

## 2013-06-26 ENCOUNTER — Ambulatory Visit: Payer: Self-pay | Admitting: Neurology

## 2013-06-27 ENCOUNTER — Ambulatory Visit: Payer: Self-pay | Admitting: Neurology

## 2014-01-02 DEATH — deceased

## 2014-01-16 ENCOUNTER — Telehealth: Payer: Self-pay

## 2014-01-16 NOTE — Telephone Encounter (Signed)
[facility]

## 2015-08-14 IMAGING — CR DG HIP (WITH OR WITHOUT PELVIS) 2-3V*L*
3 series · 3 of 3 positions shown · non-contrast
Comparison: None.

CLINICAL DATA: Fall. Left hip injury and pain.

EXAM:
LEFT HIP - COMPLETE 2+ VIEW

[x pelvis]
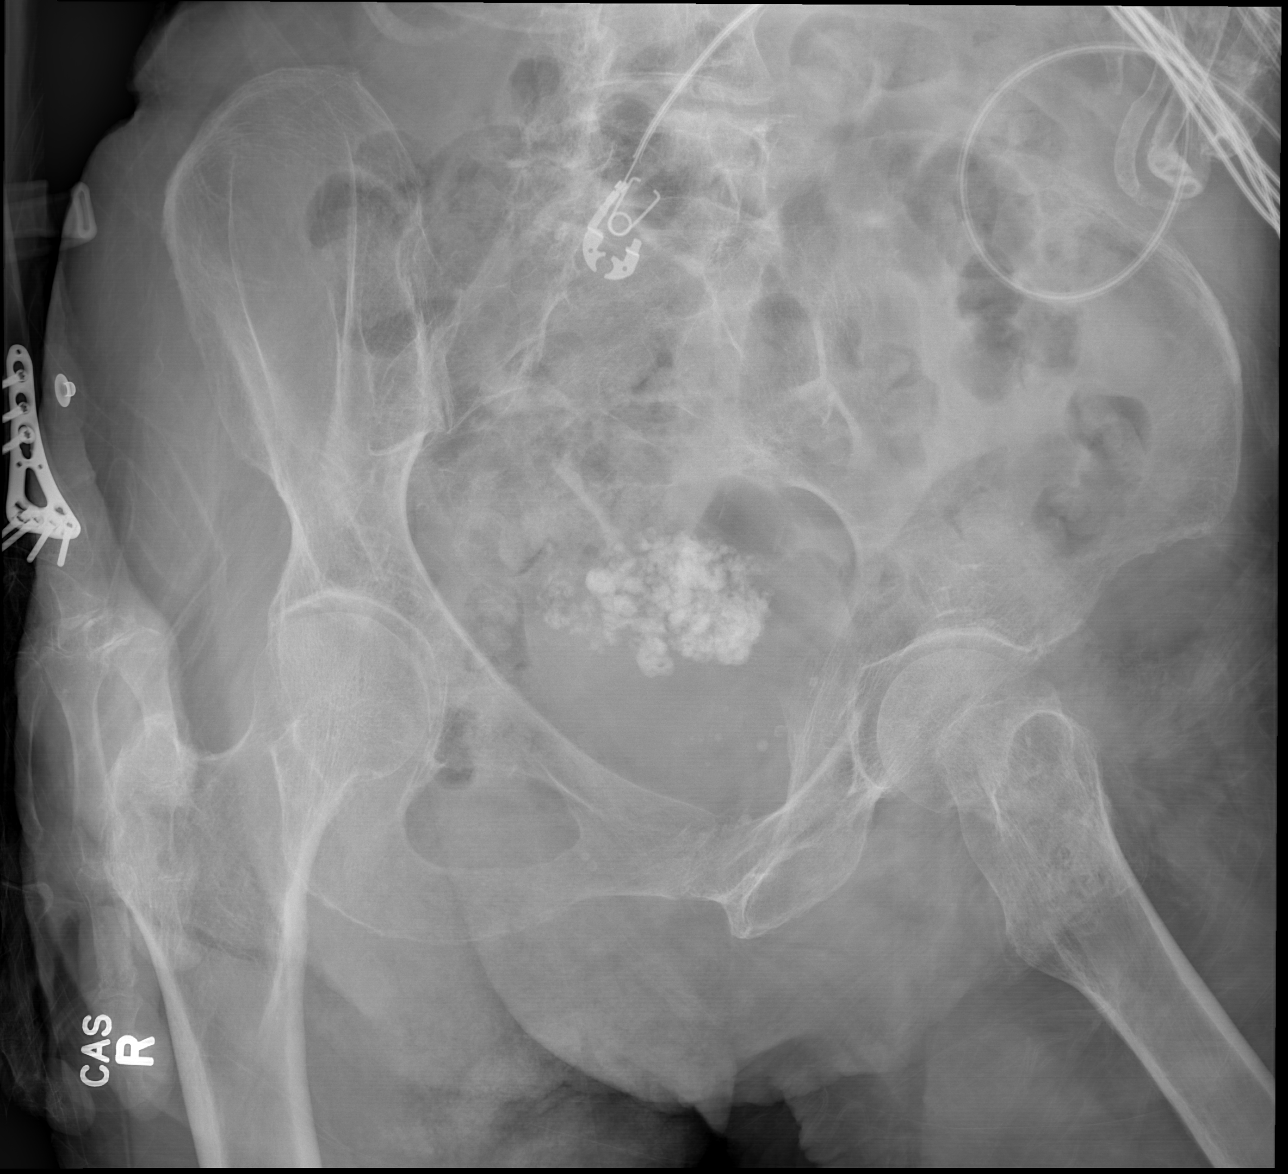

[x hip ap left]
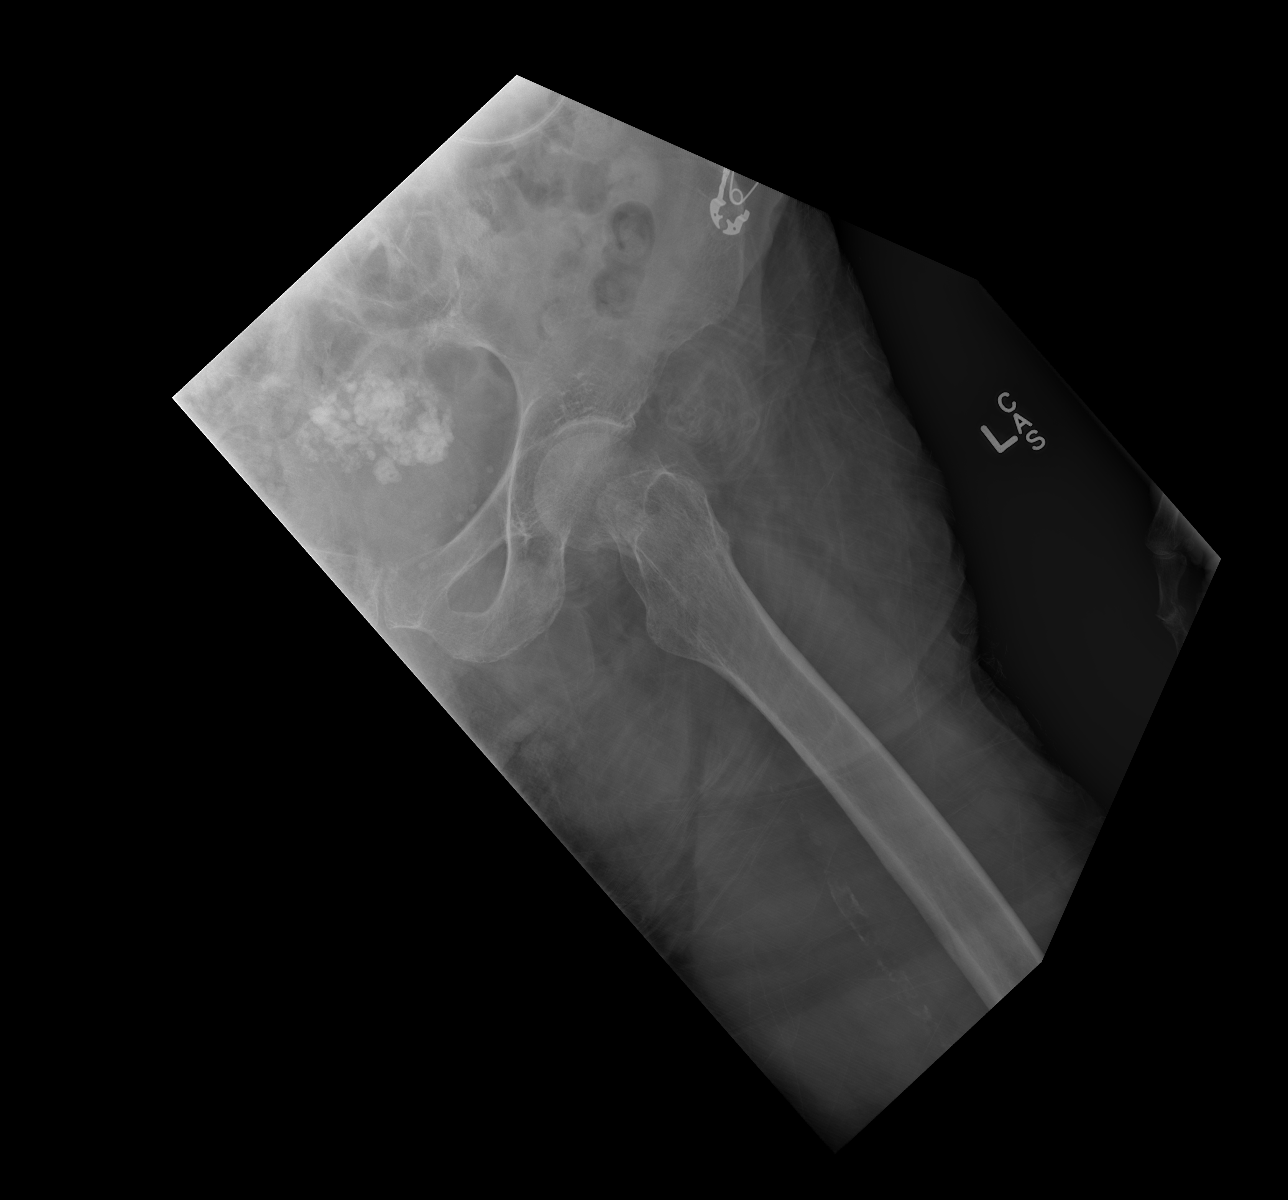

[w hip lat left]
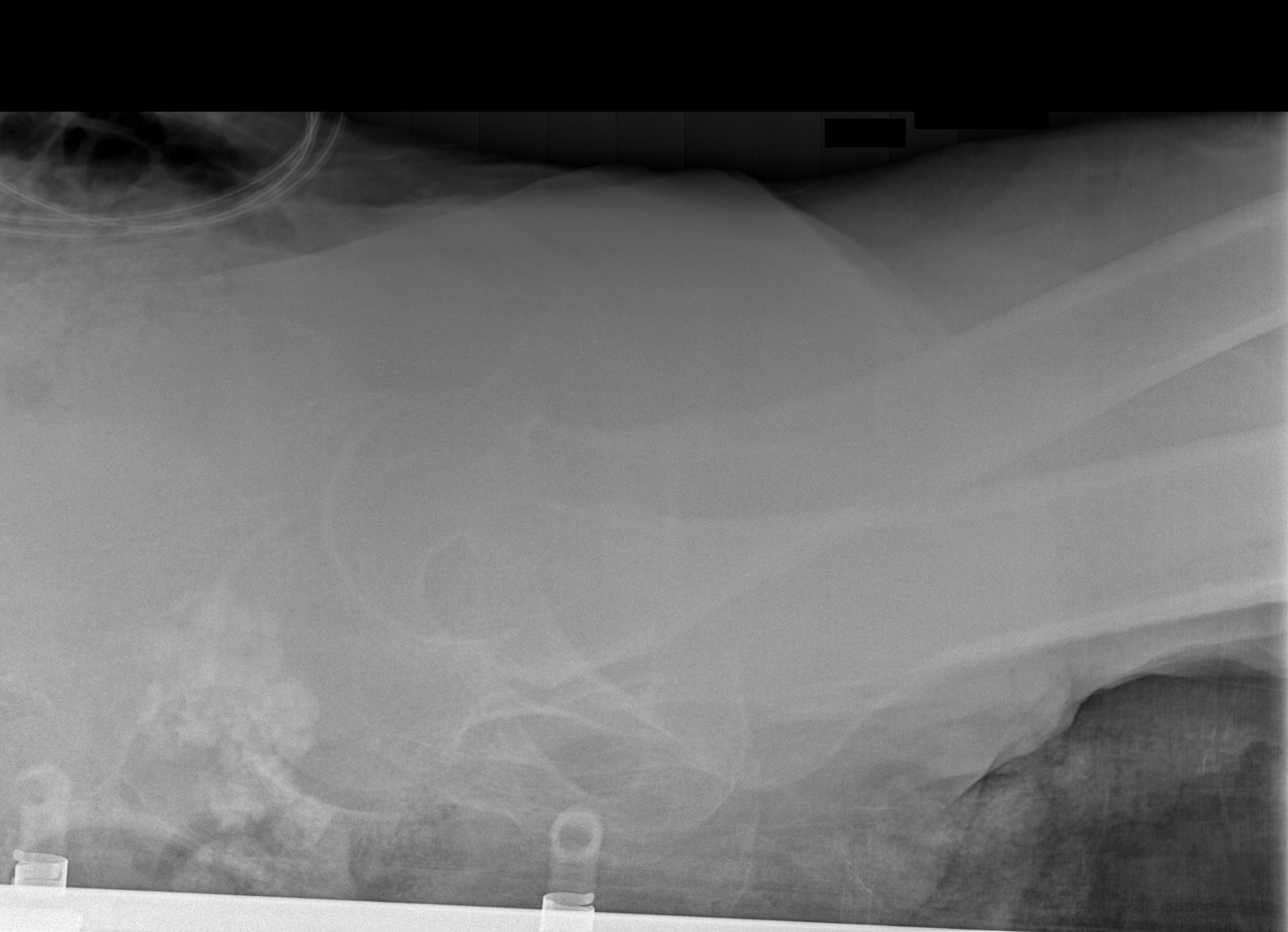

[3 of 3 positions shown; findings below may reference images not displayed]

FINDINGS: A mildly displaced left femoral neck fracture is seen. No evidence
of dislocation. No pelvic fracture identified. Large calcified
uterine fibroid noted in the central pelvis.
IMPRESSION: Mildly displaced left femoral neck fracture. Osteopenia.

## 2015-10-06 IMAGING — CR DG CHEST 2V
2 series · 2 of 2 positions shown · non-contrast
Comparison: 04/18/2013

CLINICAL DATA: Pain, shoulder pain

EXAM:
CHEST  2 VIEW

[w chest lat]
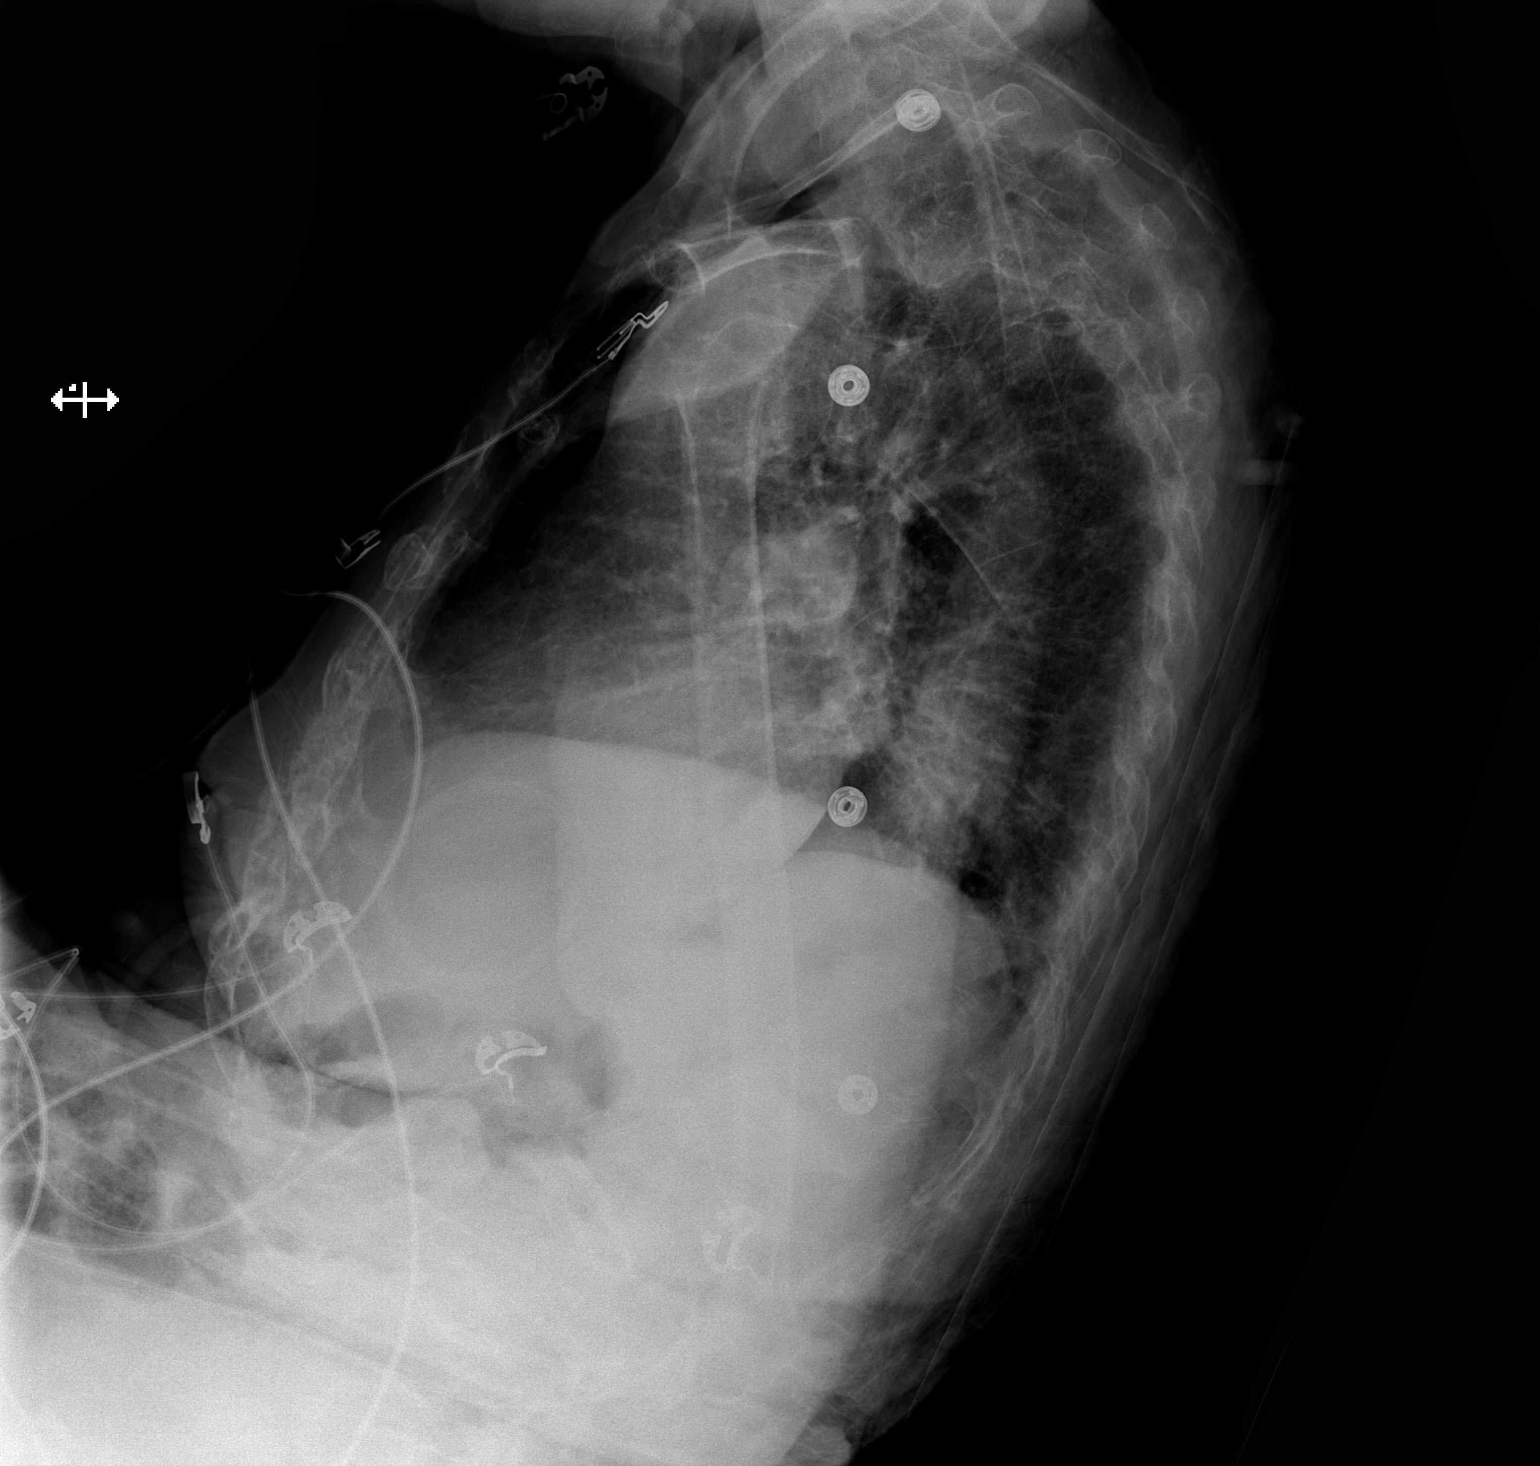

[x chest ap]
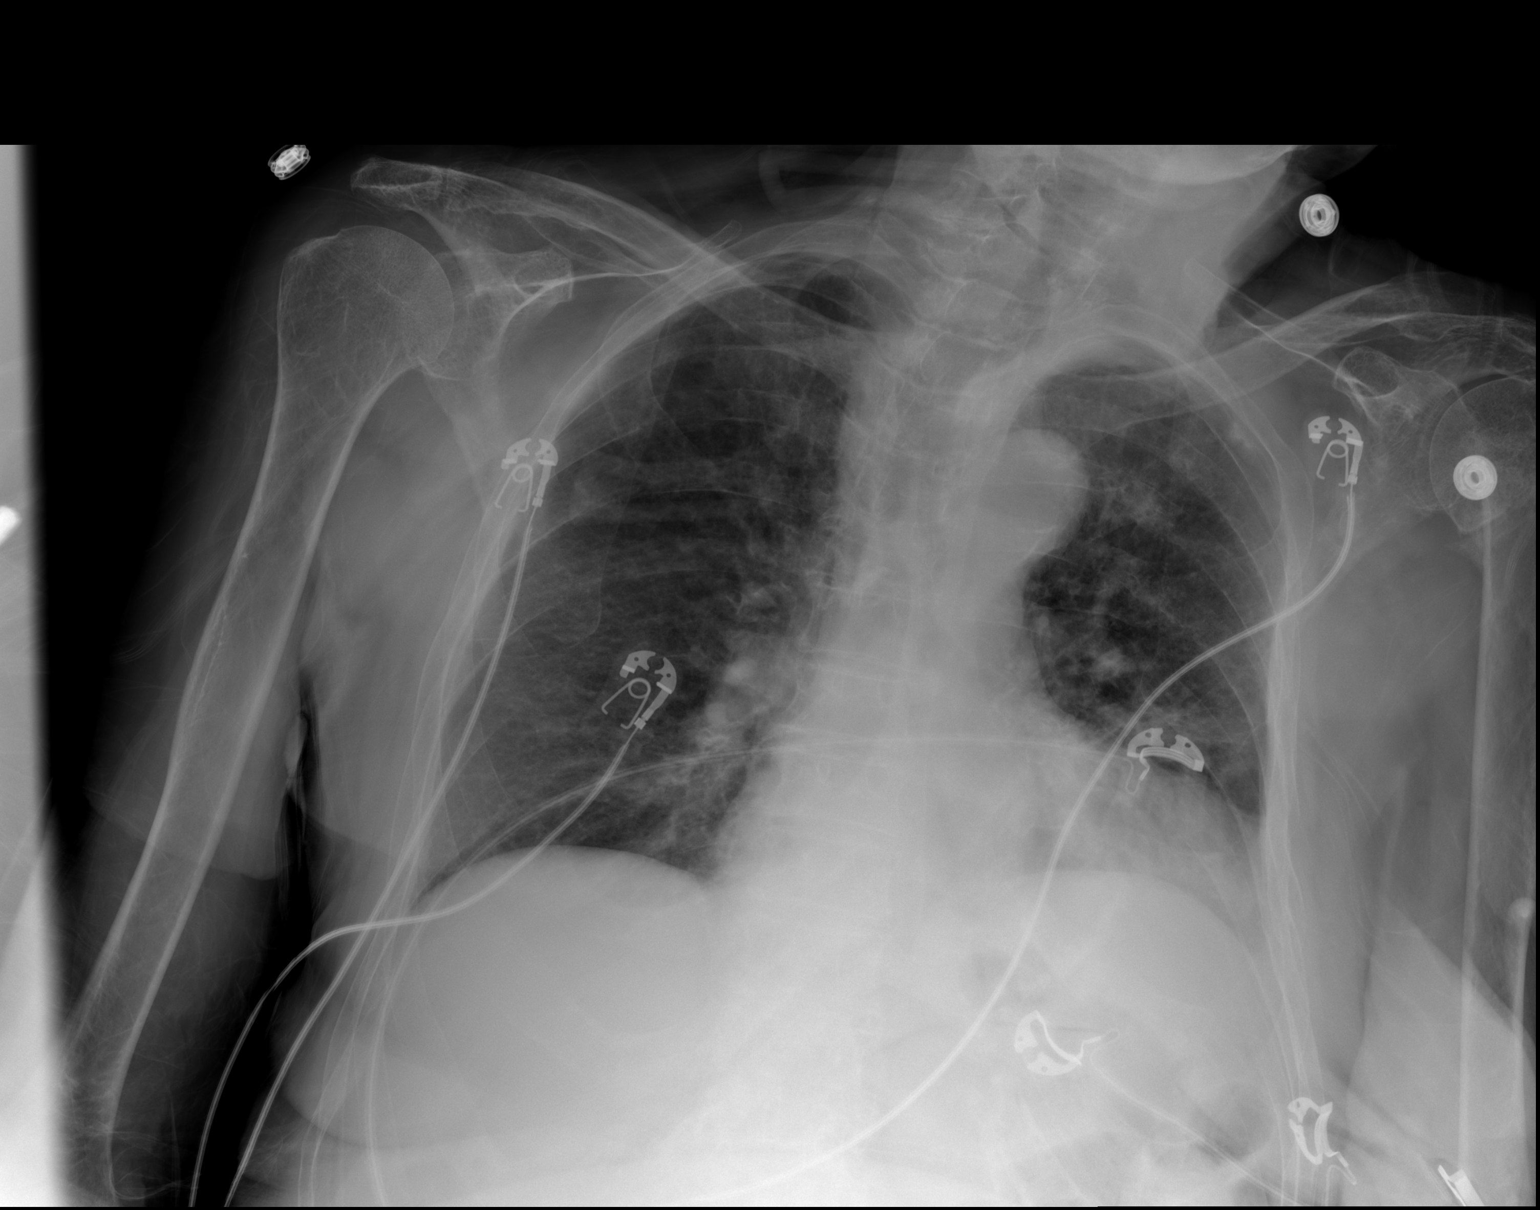

[2 of 2 positions shown; findings below may reference images not displayed]

FINDINGS: Cardiomediastinal silhouette is stable. Chronic interstitial
prominence. There is hazy atelectasis or infiltrate left base
retrocardiac. No pulmonary edema. Osteopenia and degenerative
changes thoracic spine. Chronic fracture deformity of left proximal
humerus.
IMPRESSION: Chronic interstitial prominence. There is hazy atelectasis or
infiltrate left base retrocardiac. No pulmonary edema. Osteopenia
and degenerative changes thoracic spine. Chronic fracture deformity
of left proximal humerus.
